# Patient Record
Sex: Female | Born: 1935 | Race: White | Hispanic: No | Marital: Single | Smoking: Never smoker
Health system: Southern US, Community
[De-identification: ages and names within clinical notes are randomized; demographics above are authoritative.]

## PROBLEM LIST (undated history)

## (undated) DIAGNOSIS — E7800 Pure hypercholesterolemia, unspecified: Secondary | ICD-10-CM

## (undated) DIAGNOSIS — I10 Essential (primary) hypertension: Secondary | ICD-10-CM

## (undated) DIAGNOSIS — E079 Disorder of thyroid, unspecified: Secondary | ICD-10-CM

---

## 2010-08-20 ENCOUNTER — Emergency Department (INDEPENDENT_AMBULATORY_CARE_PROVIDER_SITE_OTHER): Payer: Medicare Other

## 2010-08-20 ENCOUNTER — Emergency Department (HOSPITAL_BASED_OUTPATIENT_CLINIC_OR_DEPARTMENT_OTHER)
Admission: EM | Admit: 2010-08-20 | Discharge: 2010-08-20 | Disposition: A | Discharge status: Home / Self Care | Payer: Medicare Other | Attending: Emergency Medicine | Admitting: Emergency Medicine

## 2010-08-20 DIAGNOSIS — E039 Hypothyroidism, unspecified: Secondary | ICD-10-CM | POA: Insufficient documentation

## 2010-08-20 DIAGNOSIS — M79609 Pain in unspecified limb: Secondary | ICD-10-CM

## 2010-08-20 DIAGNOSIS — M25569 Pain in unspecified knee: Secondary | ICD-10-CM

## 2010-08-20 DIAGNOSIS — E7800 Pure hypercholesterolemia, unspecified: Secondary | ICD-10-CM | POA: Insufficient documentation

## 2010-08-20 DIAGNOSIS — Z79899 Other long term (current) drug therapy: Secondary | ICD-10-CM | POA: Insufficient documentation

## 2010-08-20 DIAGNOSIS — M25559 Pain in unspecified hip: Secondary | ICD-10-CM

## 2010-08-20 DIAGNOSIS — M1120 Other chondrocalcinosis, unspecified site: Secondary | ICD-10-CM | POA: Insufficient documentation

## 2010-08-20 DIAGNOSIS — M47817 Spondylosis without myelopathy or radiculopathy, lumbosacral region: Secondary | ICD-10-CM

## 2010-08-20 DIAGNOSIS — I10 Essential (primary) hypertension: Secondary | ICD-10-CM | POA: Insufficient documentation

## 2011-07-01 ENCOUNTER — Emergency Department (INDEPENDENT_AMBULATORY_CARE_PROVIDER_SITE_OTHER): Payer: Medicare Other

## 2011-07-01 ENCOUNTER — Emergency Department (HOSPITAL_BASED_OUTPATIENT_CLINIC_OR_DEPARTMENT_OTHER)
Admission: EM | Admit: 2011-07-01 | Discharge: 2011-07-01 | Disposition: A | Discharge status: Home / Self Care | Payer: Medicare Other | Attending: Emergency Medicine | Admitting: Emergency Medicine

## 2011-07-01 ENCOUNTER — Encounter (HOSPITAL_BASED_OUTPATIENT_CLINIC_OR_DEPARTMENT_OTHER): Payer: Self-pay | Admitting: *Deleted

## 2011-07-01 DIAGNOSIS — M1990 Unspecified osteoarthritis, unspecified site: Secondary | ICD-10-CM | POA: Insufficient documentation

## 2011-07-01 DIAGNOSIS — R109 Unspecified abdominal pain: Secondary | ICD-10-CM | POA: Insufficient documentation

## 2011-07-01 DIAGNOSIS — M79609 Pain in unspecified limb: Secondary | ICD-10-CM

## 2011-07-01 DIAGNOSIS — R059 Cough, unspecified: Secondary | ICD-10-CM | POA: Insufficient documentation

## 2011-07-01 DIAGNOSIS — J3489 Other specified disorders of nose and nasal sinuses: Secondary | ICD-10-CM | POA: Insufficient documentation

## 2011-07-01 DIAGNOSIS — M4120 Other idiopathic scoliosis, site unspecified: Secondary | ICD-10-CM

## 2011-07-01 DIAGNOSIS — E079 Disorder of thyroid, unspecified: Secondary | ICD-10-CM | POA: Insufficient documentation

## 2011-07-01 DIAGNOSIS — I10 Essential (primary) hypertension: Secondary | ICD-10-CM | POA: Insufficient documentation

## 2011-07-01 DIAGNOSIS — R05 Cough: Secondary | ICD-10-CM | POA: Insufficient documentation

## 2011-07-01 DIAGNOSIS — M549 Dorsalgia, unspecified: Secondary | ICD-10-CM

## 2011-07-01 DIAGNOSIS — E7800 Pure hypercholesterolemia, unspecified: Secondary | ICD-10-CM | POA: Insufficient documentation

## 2011-07-01 DIAGNOSIS — I517 Cardiomegaly: Secondary | ICD-10-CM

## 2011-07-01 DIAGNOSIS — M545 Low back pain, unspecified: Secondary | ICD-10-CM | POA: Insufficient documentation

## 2011-07-01 DIAGNOSIS — M810 Age-related osteoporosis without current pathological fracture: Secondary | ICD-10-CM | POA: Insufficient documentation

## 2011-07-01 DIAGNOSIS — M47817 Spondylosis without myelopathy or radiculopathy, lumbosacral region: Secondary | ICD-10-CM

## 2011-07-01 HISTORY — DX: Essential (primary) hypertension: I10

## 2011-07-01 HISTORY — DX: Pure hypercholesterolemia, unspecified: E78.00

## 2011-07-01 HISTORY — DX: Disorder of thyroid, unspecified: E07.9

## 2011-07-01 LAB — CBC
MCH: 29 pg (ref 26.0–34.0)
MCHC: 34.9 g/dL (ref 30.0–36.0)
MCV: 83.2 fL (ref 78.0–100.0)
Platelets: 159 10*3/uL (ref 150–400)

## 2011-07-01 LAB — BASIC METABOLIC PANEL
Calcium: 10 mg/dL (ref 8.4–10.5)
Creatinine, Ser: 0.6 mg/dL (ref 0.50–1.10)
GFR calc non Af Amer: 82 mL/min — ABNORMAL LOW (ref >90)
Glucose, Bld: 116 mg/dL — ABNORMAL HIGH (ref 70–99)
Sodium: 134 mEq/L — ABNORMAL LOW (ref 135–145)

## 2011-07-01 LAB — URINALYSIS, ROUTINE W REFLEX MICROSCOPIC
Glucose, UA: NEGATIVE mg/dL
Hgb urine dipstick: NEGATIVE
Ketones, ur: 15 mg/dL — AB
Protein, ur: NEGATIVE mg/dL
Urobilinogen, UA: 1 mg/dL (ref 0.0–1.0)

## 2011-07-01 MED ORDER — HYDROCODONE-ACETAMINOPHEN 5-325 MG PO TABS: 1.0000 | ORAL_TABLET | Freq: Four times a day (QID) | ORAL | Status: AC | PRN

## 2011-07-01 NOTE — ED Provider Notes (Signed)
History     CSN: [id number]  Arrival date & time 07/01/11  4098   First MD Initiated Contact with Patient 07/01/11 1019      Chief Complaint  Patient presents with  . Back Pain  . Flank Pain  . Leg Pain    (Consider location/radiation/quality/duration/timing/severity/associated sxs/prior treatment) Patient is a 78 y.o. female presenting with back pain, flank pain, and leg pain. The history is provided by the patient.  Back Pain  This is a new problem. The current episode started more than 2 days ago. The problem occurs constantly. The problem has been gradually worsening. The pain is associated with no known injury. The pain is present in the lumbar spine. The quality of the pain is described as stabbing and aching. The pain does not radiate. The pain is at a severity of 8/10. The pain is moderate. The symptoms are aggravated by bending and certain positions. Associated symptoms include leg pain. Pertinent negatives include no chest pain, no fever, no numbness, no headaches, no abdominal pain, no bowel incontinence, no perianal numbness, no bladder incontinence, no dysuria, no pelvic pain, no tingling and no weakness.  Flank Pain Pertinent negatives include no chest pain, no abdominal pain and no headaches.  Leg Pain  Pertinent negatives include no numbness and no tingling.   No trauma or injury. Despite nursing report patient denies radiation into left hip or leg. And no new weakness or numbness. Pain is left low back and flank. Also with nonproductive cough and congestions. No sore throat.  Past Medical History  Diagnosis Date  . Osteoporosis   . Hypertension   . Thyroid disease   . Hypercholesteremia     History reviewed. No pertinent past surgical history.  History reviewed. No pertinent family history.  History  Substance Use Topics  . Smoking status: Never Smoker   . Smokeless tobacco: Not on file  . Alcohol Use: No    OB History    Grav Para Term Preterm Abortions  TAB SAB Ect Mult Living                  Review of Systems  Constitutional: Negative for fever and chills.  HENT: Positive for congestion. Negative for neck pain and neck stiffness.   Respiratory: Positive for cough.   Cardiovascular: Negative for chest pain and leg swelling.  Gastrointestinal: Negative for nausea, vomiting, abdominal pain and bowel incontinence.  Genitourinary: Positive for flank pain. Negative for bladder incontinence, dysuria and pelvic pain.  Musculoskeletal: Positive for back pain.  Skin: Negative for rash.  Neurological: Negative for tingling, weakness, numbness and headaches.  Hematological: Does not bruise/bleed easily.    Allergies  Review of patient's allergies indicates no known allergies.  Home Medications   Current Outpatient Rx  Name Route Sig Dispense Refill  . ALENDRONATE SODIUM 70 MG PO TABS Oral Take 70 mg by mouth every 7 (seven) days. Take with a full glass of water on an empty stomach.    . CLONIDINE HCL 0.1 MG PO TABS Oral Take 0.1 mg by mouth as directed.    . CYCLOBENZAPRINE HCL 5 MG PO TABS Oral Take 5 mg by mouth 2 (two) times daily as needed.    Marland Kitchen LEVOTHYROXINE SODIUM 25 MCG PO TABS Oral Take 25 mcg by mouth daily.    Marland Kitchen LOSARTAN POTASSIUM 50 MG PO TABS Oral Take 50 mg by mouth daily.    Marland Kitchen SIMVASTATIN 20 MG PO TABS Oral Take 20 mg by mouth every  evening.    Marland Kitchen HYDROCODONE-ACETAMINOPHEN 5-325 MG PO TABS Oral Take 1 tablet by mouth every 6 (six) hours as needed for pain. 14 tablet 0    BP 168/79  Pulse 82  Temp(Src) 98.2 F (36.8 C) (Oral)  Resp 18  SpO2 97%  Physical Exam  Constitutional: She is oriented to person, place, and time. She appears well-developed and well-nourished. No distress.  HENT:  Head: Normocephalic and atraumatic.  Mouth/Throat: Oropharynx is clear and moist.  Eyes: Conjunctivae and EOM are normal. Pupils are equal, round, and reactive to light.  Neck: Normal range of motion. Neck supple.  Cardiovascular:  Normal rate, regular rhythm and normal heart sounds.   No murmur heard. Pulmonary/Chest: Effort normal and breath sounds normal. No respiratory distress. She has no wheezes. She has no rales.  Abdominal: Soft. Bowel sounds are normal. There is no tenderness.  Musculoskeletal: Normal range of motion. She exhibits no edema and no tenderness.  Lymphadenopathy:    She has no cervical adenopathy.  Neurological: She is alert and oriented to person, place, and time. No cranial nerve deficit. She exhibits normal muscle tone. Coordination normal.  Skin: Skin is warm. No rash noted.    ED Course  Procedures (including critical care time)  Labs Reviewed  BASIC METABOLIC PANEL - Abnormal; Notable for the following:    Sodium 134 (*)    Glucose, Bld 116 (*)    GFR calc non Af Amer 82 (*)    All other components within normal limits  URINALYSIS, ROUTINE W REFLEX MICROSCOPIC - Abnormal; Notable for the following:    Ketones, ur 15 (*)    All other components within normal limits  CBC   Dg Chest 2 View  07/01/2011  *RADIOLOGY REPORT*  Clinical Data: Back and flank pain.  CHEST - 2 VIEW  Comparison: No comparison studies available.  Findings: The lungs are clear without focal infiltrate, edema, pneumothorax or pleural effusion. Interstitial markings are diffusely coarsened with chronic features. The cardiopericardial silhouette is enlarged.  S-shaped thoracolumbar scoliosis noted. Bones are diffusely demineralized.  IMPRESSION: Cardiomegaly with chronic underlying interstitial coarsening.  No edema or focal airspace consolidation.  Original Report Authenticated By: ERIC A. MANSELL, M.D.   Dg Lumbar Spine Complete  07/01/2011  *RADIOLOGY REPORT*  Clinical Data: Left flank and leg pain.  LUMBAR SPINE - COMPLETE 4+ VIEW  Comparison: None.  Findings: Prominent convex leftward scoliosis of the lumbar spine noted.  This along with the marked diffuse bony demineralization limits assessment.  No definite  fracture is seen in the lumbar spine.  There is advanced endplate degeneration throughout the lumbar spine with prominent facet degeneration in the lower lumbar levels.  Prominent loss of disc height is seen at L4-5.  SI joints are unremarkable.  Coarse calcification projecting over the central pelvis is probably related to uterine fibroids.  IMPRESSION: Marked lumbar scoliosis with advanced degenerative disc/endplate changes and lower lumbar facet osteoarthritis.  Study limited by the scoliosis and prominent diffuse demineralization.  Original Report Authenticated By: ERIC A. MANSELL, M.D.     1. Back pain       MDM  Left back pain with radiology evidence of sig DJD to lumbar spine. No hip pain suggestive of hip fracture.No compression fracture.  No UTI, pyleonephritis, renal insufficiency, pneumonia. Cough thus most likely URI or mild bronchitis. No sig neuro deficits.         Shelda Jakes, MD 07/01/11 2027

## 2011-07-01 NOTE — ED Notes (Signed)
Pt amb to room 7 with quick steady gait in nad. Pt smiling, states she has had left flank area pain x 3 days, with pain raditating up and down her left leg along with tingling.

## 2015-08-10 ENCOUNTER — Emergency Department (HOSPITAL_COMMUNITY)
Admission: EM | Admit: 2015-08-10 | Discharge: 2015-08-10 | Disposition: A | Discharge status: Home / Self Care | Payer: Medicare Other | Attending: Emergency Medicine | Admitting: Emergency Medicine

## 2015-08-10 ENCOUNTER — Emergency Department (HOSPITAL_COMMUNITY): Payer: Medicare Other

## 2015-08-10 ENCOUNTER — Encounter (HOSPITAL_COMMUNITY): Payer: Self-pay | Admitting: Emergency Medicine

## 2015-08-10 DIAGNOSIS — E7800 Pure hypercholesterolemia, unspecified: Secondary | ICD-10-CM | POA: Insufficient documentation

## 2015-08-10 DIAGNOSIS — E079 Disorder of thyroid, unspecified: Secondary | ICD-10-CM | POA: Diagnosis not present

## 2015-08-10 DIAGNOSIS — H539 Unspecified visual disturbance: Secondary | ICD-10-CM

## 2015-08-10 DIAGNOSIS — I10 Essential (primary) hypertension: Secondary | ICD-10-CM | POA: Insufficient documentation

## 2015-08-10 DIAGNOSIS — I679 Cerebrovascular disease, unspecified: Secondary | ICD-10-CM | POA: Insufficient documentation

## 2015-08-10 DIAGNOSIS — Z79899 Other long term (current) drug therapy: Secondary | ICD-10-CM | POA: Insufficient documentation

## 2015-08-10 DIAGNOSIS — H578 Other specified disorders of eye and adnexa: Secondary | ICD-10-CM | POA: Diagnosis present

## 2015-08-10 DIAGNOSIS — Z8739 Personal history of other diseases of the musculoskeletal system and connective tissue: Secondary | ICD-10-CM | POA: Diagnosis not present

## 2015-08-10 LAB — I-STAT TROPONIN, ED: Troponin i, poc: 0 ng/mL (ref 0.00–0.08)

## 2015-08-10 LAB — CBC
HCT: 38.7 % (ref 36.0–46.0)
Hemoglobin: 12.7 g/dL (ref 12.0–15.0)
MCH: 28 pg (ref 26.0–34.0)
MCHC: 32.8 g/dL (ref 30.0–36.0)
MCV: 85.2 fL (ref 78.0–100.0)
Platelets: 167 10*3/uL (ref 150–400)
RBC: 4.54 MIL/uL (ref 3.87–5.11)
RDW: 13.2 % (ref 11.5–15.5)
WBC: 6 10*3/uL (ref 4.0–10.5)

## 2015-08-10 LAB — COMPREHENSIVE METABOLIC PANEL
ALBUMIN: 3.9 g/dL (ref 3.5–5.0)
ALK PHOS: 64 U/L (ref 38–126)
ALT: 16 U/L (ref 14–54)
AST: 24 U/L (ref 15–41)
Anion gap: 10 (ref 5–15)
BUN: 14 mg/dL (ref 6–20)
CALCIUM: 9.8 mg/dL (ref 8.9–10.3)
CHLORIDE: 101 mmol/L (ref 101–111)
CO2: 25 mmol/L (ref 22–32)
CREATININE: 0.96 mg/dL (ref 0.44–1.00)
GFR calc non Af Amer: 51 mL/min — ABNORMAL LOW (ref >60)
GFR, EST AFRICAN AMERICAN: 59 mL/min — AB (ref >60)
GLUCOSE: 114 mg/dL — AB (ref 65–99)
Potassium: 4.6 mmol/L (ref 3.5–5.1)
SODIUM: 136 mmol/L (ref 135–145)
Total Bilirubin: 0.6 mg/dL (ref 0.3–1.2)
Total Protein: 5.9 g/dL — ABNORMAL LOW (ref 6.5–8.1)

## 2015-08-10 LAB — PROTIME-INR
INR: 1.02 (ref 0.00–1.49)
INR: 1.09 (ref 0.00–1.49)
PROTHROMBIN TIME: 13.6 s (ref 11.6–15.2)
PROTHROMBIN TIME: 14.3 s (ref 11.6–15.2)

## 2015-08-10 LAB — RAPID URINE DRUG SCREEN, HOSP PERFORMED
AMPHETAMINES: NOT DETECTED
BENZODIAZEPINES: NOT DETECTED
Barbiturates: NOT DETECTED
Cocaine: NOT DETECTED
Opiates: NOT DETECTED
TETRAHYDROCANNABINOL: NOT DETECTED

## 2015-08-10 LAB — I-STAT CHEM 8, ED
BUN: 17 mg/dL (ref 6–20)
Calcium, Ion: 1.17 mmol/L (ref 1.13–1.30)
Chloride: 99 mmol/L — ABNORMAL LOW (ref 101–111)
Creatinine, Ser: 1 mg/dL (ref 0.44–1.00)
Glucose, Bld: 106 mg/dL — ABNORMAL HIGH (ref 65–99)
HEMATOCRIT: 42 % (ref 36.0–46.0)
HEMOGLOBIN: 14.3 g/dL (ref 12.0–15.0)
Potassium: 4.4 mmol/L (ref 3.5–5.1)
SODIUM: 135 mmol/L (ref 135–145)
TCO2: 25 mmol/L (ref 0–100)

## 2015-08-10 LAB — URINALYSIS, ROUTINE W REFLEX MICROSCOPIC
BILIRUBIN URINE: NEGATIVE
Glucose, UA: NEGATIVE mg/dL
HGB URINE DIPSTICK: NEGATIVE
KETONES UR: NEGATIVE mg/dL
Leukocytes, UA: NEGATIVE
NITRITE: NEGATIVE
PH: 7.5 (ref 5.0–8.0)
Protein, ur: NEGATIVE mg/dL
SPECIFIC GRAVITY, URINE: 1.016 (ref 1.005–1.030)

## 2015-08-10 LAB — DIFFERENTIAL
BASOS ABS: 0 10*3/uL (ref 0.0–0.1)
BASOS PCT: 1 %
EOS ABS: 0.3 10*3/uL (ref 0.0–0.7)
EOS PCT: 5 %
Lymphocytes Relative: 16 %
Lymphs Abs: 0.9 10*3/uL (ref 0.7–4.0)
MONOS PCT: 9 %
Monocytes Absolute: 0.5 10*3/uL (ref 0.1–1.0)
NEUTROS PCT: 69 %
Neutro Abs: 4.2 10*3/uL (ref 1.7–7.7)

## 2015-08-10 LAB — ETHANOL: Alcohol, Ethyl (B): <5 mg/dL (ref <5)

## 2015-08-10 LAB — APTT: aPTT: 25 seconds (ref 24–37)

## 2015-08-10 MED ORDER — IOPAMIDOL (ISOVUE-370) INJECTION 76%
INTRAVENOUS | Status: AC
  Administered 2015-08-10: 80 mL
  Filled 2015-08-10: qty 100

## 2015-08-10 MED ORDER — LABETALOL HCL 5 MG/ML IV SOLN
10.0000 mg | Freq: Once | INTRAVENOUS | Status: AC
Start: 2015-08-10 — End: 2015-08-10
  Administered 2015-08-10: 10 mg via INTRAVENOUS
  Filled 2015-08-10: qty 4

## 2015-08-10 NOTE — Discharge Instructions (Signed)

## 2015-08-10 NOTE — ED Notes (Signed)
[facility] transported [facility] to [facility] by staff.

## 2015-08-10 NOTE — ED Notes (Signed)
Code Stroke cancelled @ 12:59. Spoke w/ Michele McalpinePhil.

## 2015-08-10 NOTE — ED Provider Notes (Signed)
CSN: [id number]     Arrival date & time 08/10/15  1242 History   First MD Initiated Contact with Patient 08/10/15 1242     Chief Complaint  Patient presents with  . Code Stroke   LEVEL 5 CAVEAT DUE TO ACUITY OF CONDITION  Patient is a 82 y.o. female presenting with eye problem. The history is provided by the patient. The history is limited by the condition of the patient.  Eye Problem Location:  L eye Severity:  Severe Onset quality:  Sudden Timing:  Constant Progression:  Resolved Chronicity:  New Context comment:  FLASH OF LIGHT Relieved by:  None tried Worsened by:  Nothing tried pt reports onset of "flash of light like a lightbulb broke" and had some right sided numbness No HA No focal weakness She otherwise felt well and at her baseline   Past Medical History  Diagnosis Date  . Osteoporosis   . Hypertension   . Thyroid disease   . Hypercholesteremia    History reviewed. No pertinent past surgical history. No family history on file. Social History  Substance Use Topics  . Smoking status: Never Smoker   . Smokeless tobacco: None  . Alcohol Use: No   OB History    No data available     Review of Systems  Unable to perform ROS: Acuity of condition      Allergies  Review of patient's allergies indicates no known allergies.  Home Medications   Prior to Admission medications   Medication Sig Start Date End Date Taking? Authorizing Provider  alendronate (FOSAMAX) 70 MG tablet Take 70 mg by mouth every 7 (seven) days. Take with a full glass of water on an empty stomach.    Historical Provider, MD  cloNIDine (CATAPRES) 0.1 MG tablet Take 0.1 mg by mouth as directed.    Historical Provider, MD  cyclobenzaprine (FLEXERIL) 5 MG tablet Take 5 mg by mouth 2 (two) times daily as needed.    Historical Provider, MD  levothyroxine (SYNTHROID, LEVOTHROID) 25 MCG tablet Take 25 mcg by mouth daily.    Historical Provider, MD  losartan (COZAAR) 50 MG tablet Take 50 mg by  mouth daily.    Historical Provider, MD  simvastatin (ZOCOR) 20 MG tablet Take 20 mg by mouth every evening.    Historical Provider, MD   BP 164/79 mmHg  Pulse 88  Temp(Src) 97.6 F (36.4 C) (Oral)  Resp 16  Ht 5\' 5"  (1.651 m)  Wt 90 kg  BMI 33.02 kg/m2  SpO2 99% Physical Exam CONSTITUTIONAL: Well developed/well nourished HEAD: Normocephalic/atraumatic EYES: EOMI/PERRL ENMT: Mucous membranes moist NECK: supple no meningeal signs CV: S1/S2 noted, no murmurs/rubs/gallops noted LUNGS: Lungs are clear to auscultation bilaterally, no apparent distress ABDOMEN: soft, nontender NEURO: Pt is awake/alert/appropriate, moves all extremitiesx4.  No facial droop.  No arm/leg drift EXTREMITIES: full ROM SKIN: warm, color normal PSYCH: no abnormalities of mood noted, alert and oriented to situation  ED Course  Procedures  1:25 PM tPA in stroke considered but not given due to: Cancelled by dr Lavon Paganininandigam neurology 4:04 PM Pt stable No acute findings on CT imaging Pt is awake/alert No focal weakness She is ambulatory She denies HA She has no visual changes She feels well I spoke to dr Lavon Paganininandigam He felt she did not have focal neuro exam He feels she is stable for d/c back to facility Pt agreeable with plan   Labs Review Labs Reviewed  I-STAT CHEM 8, ED - Abnormal; Notable for the  following:    Chloride 99 (*)    Glucose, Bld 106 (*)    All other components within normal limits  CBC  DIFFERENTIAL  ETHANOL  PROTIME-INR  APTT  COMPREHENSIVE METABOLIC PANEL  URINE RAPID DRUG SCREEN, HOSP PERFORMED  URINALYSIS, ROUTINE W REFLEX MICROSCOPIC (NOT AT [facility])  I-STAT TROPOININ, ED    Imaging Review Ct Angio Head W/cm &/or Wo Cm  08/10/2015  CLINICAL DATA:  82 year old female code stroke with right side weakness. Initial encounter. EXAM: CT ANGIOGRAPHY HEAD AND NECK TECHNIQUE: Multidetector CT imaging of the head and neck was performed using the standard protocol during bolus  administration of intravenous contrast. Multiplanar CT image reconstructions and MIPs were obtained to evaluate the vascular anatomy. Carotid stenosis measurements (when applicable) are obtained utilizing NASCET criteria, using the distal internal carotid diameter as the denominator. CONTRAST:  80 mL Isovue 370. COMPARISON:  Head CT without contrast 1240 hours today. FINDINGS: CTA NECK Skeleton: Widespread cervical spine degeneration. Previous left frontotemporal craniotomy for aneurysm clipping. No acute osseous abnormality identified. Visualized paranasal sinuses and mastoids are clear. Other neck: Emphysema. Dependent opacity in the upper lungs favored to be atelectasis. No superior mediastinal lymphadenopathy. Left greater than right thyroid goiter with mostly subcentimeter thyroid nodules. The glottis is closed. Negative pharynx, parapharyngeal spaces and retropharyngeal space. Negative sublingual space, submandibular glands and parotid glands. No cervical lymphadenopathy. Aortic arch: 3 vessel arch configuration with moderate soft and calcified arch atherosclerosis. Right carotid system: Negative right CCA origin. Tortuous proximal right CCA with a kinked appearance. Right carotid bifurcation calcified plaque but not affecting the right ICA origin. However, there is right ICA bulb plaque resulting in 65 % stenosis with respect to the distal vessel. Distal to the bulb the cervical right ICA is negative. Left carotid system: Calcified plaque at the left CCA origin without stenosis. Mildly tortuous proximal left CCA. Circumferential calcified plaque at the left carotid bifurcation and ICA bulb but no associated stenosis. The left ICA appears dominant, and is mildly tortuous proximal to the skullbase. Vertebral arteries: Tortuous right subclavian artery with a kinked appearance at the thoracic inlet. Normal right vertebral artery origin. Dominant right vertebral artery is tortuous in the V2 segment, but otherwise  normal to the skullbase. Soft and calcified plaque in the proximal left subclavian artery resulting in up to 50 % stenosis with respect to the distal vessel. Severe soft and calcified plaque at the left vertebral artery origin with severe stenosis. Diminutive left vertebral artery with poor enhancement in the left V2 segment, but appears to remain patent to the skullbase. CTA HEAD Posterior circulation: Dominant right and diminutive left distal vertebral arteries. Mild calcified plaque in the right V4 segment without stenosis. The left V4 segment is probably supplied in a retrograde fashion. Normal left PICA origin which occurs early. Dominant appearing right AICA origin is normal. No basilar artery stenosis. Patent SCA and right PCA origins. Fetal type left PCA origin. Bilateral PCA branches are normal. Anterior circulation: Non dominant appearing right ICA siphon is patent with mild calcified plaque and no stenosis. Normal right ophthalmic artery origin. Patent right ICA terminus with diminutive or absent right ACA A1 segment. Dominant and dolichoectatic left ICA siphon is patent with mild calcified plaque and no stenosis. The left ophthalmic artery origin is not normal. There is a left supraclinoid segment aneurysm clip. No aneurysmal enhancement identified. Left posterior communicating artery origin is separate and normal. Left ICA terminus is patent. Dominant left A1 segment with normal origin.  Anterior communicating artery and bilateral ACA branches are within normal limits. Right MCA origin is normal. The right M1 segment is patent with mild irregularity. Right MCA bifurcation and MCA branches are within normal limits. Left MCA origin, M1 segment, bifurcation, and left MCA branches are within normal limits. Venous sinuses: Patent. Anatomic variants: Dominant right vertebral artery. Dominant left ICA siphon and left ACA A1 segment. Delayed phase: Stable gray-white matter differentiation. No abnormal  enhancement identified. IMPRESSION: 1. Negative for emergent large vessel occlusion. 2. Severe atherosclerotic stenosis at the left vertebral artery origin with subsequent poor left vertebral artery enhancement in the neck. The right vertebral artery is dominant and without stenosis. The left PICA and V4 segment appeared to be supplied in a retrograde fashion from the right. 3. Cervical right ICA calcified plaque with 65% stenosis. Left carotid calcified plaque without stenosis. 4. 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 5. Stable CT appearance of the brain. 6. Emphysema.  Thyroid multinodular goiter. Electronically Signed   By: Odessa Fleming M.D.   On: 08/10/2015 14:00   Ct Head Wo Contrast  08/10/2015  CLINICAL DATA:  Code stroke for right-sided weakness after seeing/light. History of aneurysm repair. EXAM: CT HEAD WITHOUT CONTRAST TECHNIQUE: Contiguous axial images were obtained from the base of the skull through the vertex without intravenous contrast. COMPARISON:  05/26/2013 FINDINGS: Skull and Sinuses:Remote left frontal pterional craniotomy. No acute or destructive findings. Visualized orbits: Bilateral cataract resection.  No acute finding. Brain: No evidence of acute infarction, hemorrhage, hydrocephalus, or mass lesion/mass effect. Remote left carotid terminus region aneurysm clipping. There is unavoidable streak artifact which obscures portions of the brain at the clip level. Chronic small vessel disease with confluent ischemic gliosis in the biparietal white matter. There has been remote lacunar infarcts in the right frontal white matter and anterior limb internal capsule on the left. Probable dilated perivascular space below the right putamen. Small calcific density within an high right parietal sulcus, contralateral to the symptoms. Normal cerebral volume for age. These results were called by telephone at the time of interpretation on 08/10/2015 at 12:58 pm to Dr.  Lavon Paganini, who verbally acknowledged these results. IMPRESSION: 1. No acute finding. 2. Extensive chronic small vessel disease. 3. Remote aneurysm clipping on the left. Electronically Signed   By: Marnee Spring M.D.   On: 08/10/2015 12:59   Ct Angio Neck W/cm &/or Wo/cm  08/10/2015  CLINICAL DATA:  82 year old female code stroke with right side weakness. Initial encounter. EXAM: CT ANGIOGRAPHY HEAD AND NECK TECHNIQUE: Multidetector CT imaging of the head and neck was performed using the standard protocol during bolus administration of intravenous contrast. Multiplanar CT image reconstructions and MIPs were obtained to evaluate the vascular anatomy. Carotid stenosis measurements (when applicable) are obtained utilizing NASCET criteria, using the distal internal carotid diameter as the denominator. CONTRAST:  80 mL Isovue 370. COMPARISON:  Head CT without contrast 1240 hours today. FINDINGS: CTA NECK Skeleton: Widespread cervical spine degeneration. Previous left frontotemporal craniotomy for aneurysm clipping. No acute osseous abnormality identified. Visualized paranasal sinuses and mastoids are clear. Other neck: Emphysema. Dependent opacity in the upper lungs favored to be atelectasis. No superior mediastinal lymphadenopathy. Left greater than right thyroid goiter with mostly subcentimeter thyroid nodules. The glottis is closed. Negative pharynx, parapharyngeal spaces and retropharyngeal space. Negative sublingual space, submandibular glands and parotid glands. No cervical lymphadenopathy. Aortic arch: 3 vessel arch configuration with moderate soft and calcified arch atherosclerosis. Right carotid system: Negative right  CCA origin. Tortuous proximal right CCA with a kinked appearance. Right carotid bifurcation calcified plaque but not affecting the right ICA origin. However, there is right ICA bulb plaque resulting in 65 % stenosis with respect to the distal vessel. Distal to the bulb the cervical right ICA is  negative. Left carotid system: Calcified plaque at the left CCA origin without stenosis. Mildly tortuous proximal left CCA. Circumferential calcified plaque at the left carotid bifurcation and ICA bulb but no associated stenosis. The left ICA appears dominant, and is mildly tortuous proximal to the skullbase. Vertebral arteries: Tortuous right subclavian artery with a kinked appearance at the thoracic inlet. Normal right vertebral artery origin. Dominant right vertebral artery is tortuous in the V2 segment, but otherwise normal to the skullbase. Soft and calcified plaque in the proximal left subclavian artery resulting in up to 50 % stenosis with respect to the distal vessel. Severe soft and calcified plaque at the left vertebral artery origin with severe stenosis. Diminutive left vertebral artery with poor enhancement in the left V2 segment, but appears to remain patent to the skullbase. CTA HEAD Posterior circulation: Dominant right and diminutive left distal vertebral arteries. Mild calcified plaque in the right V4 segment without stenosis. The left V4 segment is probably supplied in a retrograde fashion. Normal left PICA origin which occurs early. Dominant appearing right AICA origin is normal. No basilar artery stenosis. Patent SCA and right PCA origins. Fetal type left PCA origin. Bilateral PCA branches are normal. Anterior circulation: Non dominant appearing right ICA siphon is patent with mild calcified plaque and no stenosis. Normal right ophthalmic artery origin. Patent right ICA terminus with diminutive or absent right ACA A1 segment. Dominant and dolichoectatic left ICA siphon is patent with mild calcified plaque and no stenosis. The left ophthalmic artery origin is not normal. There is a left supraclinoid segment aneurysm clip. No aneurysmal enhancement identified. Left posterior communicating artery origin is separate and normal. Left ICA terminus is patent. Dominant left A1 segment with normal origin.  Anterior communicating artery and bilateral ACA branches are within normal limits. Right MCA origin is normal. The right M1 segment is patent with mild irregularity. Right MCA bifurcation and MCA branches are within normal limits. Left MCA origin, M1 segment, bifurcation, and left MCA branches are within normal limits. Venous sinuses: Patent. Anatomic variants: Dominant right vertebral artery. Dominant left ICA siphon and left ACA A1 segment. Delayed phase: Stable gray-white matter differentiation. No abnormal enhancement identified. IMPRESSION: 1. Negative for emergent large vessel occlusion. 2. Severe atherosclerotic stenosis at the left vertebral artery origin with subsequent poor left vertebral artery enhancement in the neck. The right vertebral artery is dominant and without stenosis. The left PICA and V4 segment appeared to be supplied in a retrograde fashion from the right. 3. Cervical right ICA calcified plaque with 65% stenosis. Left carotid calcified plaque without stenosis. 4. 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 5. Stable CT appearance of the brain. 6. Emphysema.  Thyroid multinodular goiter. Electronically Signed   By: Odessa Fleming M.D.   On: 08/10/2015 14:00   I have personally reviewed and evaluated these  lab results as part of my medical decision-making.   EKG Interpretation   Date/Time:  Thursday August 10 2015 14:31:28 EDT Ventricular Rate:  73 PR Interval:  182 QRS Duration: 131 QT Interval:  447 QTC Calculation: 493 R Axis:   23 Text Interpretation:  Sinus rhythm Left bundle branch block No previous  ECGs available  Confirmed by Bebe Shaggy  MD, Luvia Orzechowski ([id number]) on 08/10/2015  2:37:08 PM Also confirmed by Bebe Shaggy  MD, [place] Winton ([id number]), editor Stout  CT, Jola Babinski [phone])  on 08/10/2015 2:58:13 PM      MDM   Final diagnoses:  Visual disturbance  Cerebrovascular disease    Nursing notes including past medical history and social history reviewed  and considered in documentation Labs/vital reviewed myself and considered during evaluation     Zadie Rhine, MD 08/10/15 1605

## 2015-08-10 NOTE — ED Notes (Signed)
[facility], developed right sided numbness which resolved pta, VSS, CBG 150, NAD

## 2015-08-10 NOTE — ED Notes (Signed)
[facility] from assisted living, pt reported a "flash of light" followed by right sided numbness - resolved pta, no complaints, pain or deficits on arrival, CBG 150, VSS, NAD

## 2015-08-10 NOTE — ED Notes (Signed)
Pt ambulated with walker at her self reported baseline, no focal weakness or gait abnormality noted

## 2015-08-11 NOTE — Code Documentation (Signed)
Patient states that she suddenly experienced and explosion of behind her left eye which lasted about a minute or so, it was not associated with any headache, but EMS did note some decreased sensory on right.  She has a history of an aneurysm.  Stat head CT and labs done.  NIHSS 0, CTA of the head and neck done.  Code Stroke Canceled.

## 2020-01-28 ENCOUNTER — Emergency Department (HOSPITAL_COMMUNITY)
Admission: EM | Admit: 2020-01-28 | Discharge: 2020-01-28 | Disposition: A | Discharge status: Home / Self Care | Payer: Medicare PPO | Attending: Emergency Medicine | Admitting: Emergency Medicine

## 2020-01-28 ENCOUNTER — Encounter (HOSPITAL_COMMUNITY): Payer: Self-pay | Admitting: Emergency Medicine

## 2020-01-28 ENCOUNTER — Other Ambulatory Visit: Payer: Self-pay

## 2020-01-28 ENCOUNTER — Emergency Department (HOSPITAL_COMMUNITY): Payer: Medicare PPO

## 2020-01-28 DIAGNOSIS — Z7982 Long term (current) use of aspirin: Secondary | ICD-10-CM | POA: Diagnosis not present

## 2020-01-28 DIAGNOSIS — K047 Periapical abscess without sinus: Secondary | ICD-10-CM

## 2020-01-28 DIAGNOSIS — Z91040 Latex allergy status: Secondary | ICD-10-CM | POA: Insufficient documentation

## 2020-01-28 DIAGNOSIS — Z20822 Contact with and (suspected) exposure to covid-19: Secondary | ICD-10-CM | POA: Insufficient documentation

## 2020-01-28 DIAGNOSIS — Z79899 Other long term (current) drug therapy: Secondary | ICD-10-CM | POA: Diagnosis not present

## 2020-01-28 DIAGNOSIS — K0889 Other specified disorders of teeth and supporting structures: Secondary | ICD-10-CM | POA: Diagnosis present

## 2020-01-28 DIAGNOSIS — I10 Essential (primary) hypertension: Secondary | ICD-10-CM | POA: Insufficient documentation

## 2020-01-28 LAB — CBC WITH DIFFERENTIAL/PLATELET
Abs Immature Granulocytes: 0.03 10*3/uL (ref 0.00–0.07)
Basophils Absolute: 0.1 10*3/uL (ref 0.0–0.1)
Basophils Relative: 1 %
Eosinophils Absolute: 0.1 10*3/uL (ref 0.0–0.5)
Eosinophils Relative: 1 %
HCT: 39.1 % (ref 36.0–46.0)
Hemoglobin: 12.2 g/dL (ref 12.0–15.0)
Immature Granulocytes: 1 %
Lymphocytes Relative: 12 %
Lymphs Abs: 0.8 10*3/uL (ref 0.7–4.0)
MCH: 26.9 pg (ref 26.0–34.0)
MCHC: 31.2 g/dL (ref 30.0–36.0)
MCV: 86.3 fL (ref 80.0–100.0)
Monocytes Absolute: 0.6 10*3/uL (ref 0.1–1.0)
Monocytes Relative: 9 %
Neutro Abs: 4.9 10*3/uL (ref 1.7–7.7)
Neutrophils Relative %: 76 %
Platelets: 276 10*3/uL (ref 150–400)
RBC: 4.53 MIL/uL (ref 3.87–5.11)
RDW: 14.5 % (ref 11.5–15.5)
WBC: 6.4 10*3/uL (ref 4.0–10.5)
nRBC: 0 % (ref 0.0–0.2)

## 2020-01-28 LAB — RESPIRATORY PANEL BY RT PCR (FLU A&B, COVID)
Influenza A by PCR: NEGATIVE
Influenza B by PCR: NEGATIVE
SARS Coronavirus 2 by RT PCR: NEGATIVE

## 2020-01-28 LAB — BASIC METABOLIC PANEL
Anion gap: 11 (ref 5–15)
BUN: 12 mg/dL (ref 8–23)
CO2: 25 mmol/L (ref 22–32)
Calcium: 9.2 mg/dL (ref 8.9–10.3)
Chloride: 99 mmol/L (ref 98–111)
Creatinine, Ser: 0.81 mg/dL (ref 0.44–1.00)
GFR, Estimated: >60 mL/min (ref >60)
Glucose, Bld: 116 mg/dL — ABNORMAL HIGH (ref 70–99)
Potassium: 4.5 mmol/L (ref 3.5–5.1)
Sodium: 135 mmol/L (ref 135–145)

## 2020-01-28 MED ORDER — AMOXICILLIN-POT CLAVULANATE 400-57 MG/5ML PO SUSR
800.0000 mg | Freq: Two times a day (BID) | ORAL | Status: DC
  Filled 2020-01-28: qty 10

## 2020-01-28 MED ORDER — IOHEXOL 300 MG/ML  SOLN
75.0000 mL | Freq: Once | INTRAMUSCULAR | Status: AC | PRN
  Administered 2020-01-28: 75 mL via INTRAVENOUS

## 2020-01-28 MED ORDER — AMOXICILLIN-POT CLAVULANATE 875-125 MG PO TABS: 1.0000 | ORAL_TABLET | Freq: Two times a day (BID) | ORAL | 0 refills | Status: DC

## 2020-01-28 MED ORDER — SODIUM CHLORIDE 0.9 % IV SOLN
3.0000 g | Freq: Once | INTRAVENOUS | Status: AC
  Administered 2020-01-28: 3 g via INTRAVENOUS
  Filled 2020-01-28: qty 8

## 2020-01-28 MED ORDER — AMOXICILLIN-POT CLAVULANATE 400-57 MG/5ML PO SUSR: 800.0000 mg | Freq: Two times a day (BID) | ORAL | 0 refills | Status: AC

## 2020-01-28 NOTE — Discharge Instructions (Addendum)
Stop amoxicillin.  Start taking Augmentin as prescribed.   Follow up with Dr. Kenney Houseman as instructed.

## 2020-01-28 NOTE — ED Notes (Signed)
Pt has 3+ swelling of left side of jaw.

## 2020-01-28 NOTE — Consult Note (Signed)
Hospitalist Service Medical Consultation   Kristi Collins  UUV:[id number]  DOB: 05/28/1925  DOA: 01/28/2020  PCP: Patient, No Pcp Per   Requesting physician: Dr. Orlene Plumextra  Reason for consultation: Possible admission for tooth infection   History of Present Illness: Kristi Collins is an 86 y.o. female with PMH significant for some dementia, HTN and hypothyroidism who was sent by her facility per recommendations of her dentist Dr. Fidela SalisburySimoncic for evaluation by oral surgery.  Patient has been having increasing swelling of her left jaw for the past several days.  She was seen by her dentist who noted infected teeth and put her on amoxicillin without improvement in the swelling.  Per ED report, Dr. Fidela SalisburySimoncic wanted the patient referred to oral surgery for what might be complicated surgery.   Patient underwent a CT of her head and neck which showed no evidence of abscess or bone infection.  She does have several rotting teeth and soft tissue swelling around them.  ED physician discussed patient with Dr. Kenney Housemanrab who recommended admission for IV antibiotics but he did not feel there was any need for oral surgery intervention at this point.  He wanted to see her as an outpatient in follow-up.  Of note, patient's POA is present and states that it is possible that has not been taking her amoxicillin regularly but is not sure. She herself states that she thinks she can eat or drink without difficulty.  She can speak without difficulty.  Denies significant pain.  Patient denies fevers or chills but it is not really clear how reliable her history is.  She does not remember whether she had breakfast this morning.   Review of Systems:  Patient has memory loss that makes ROS inaccurate.  Past Medical History: Past Medical History:  Diagnosis Date  . Hypercholesteremia   . Hypertension   . Osteoporosis   . Thyroid disease     Past Surgical History: History reviewed. No pertinent surgical  history.   Allergies:   Allergies  Allergen Reactions  . Latex      Social History:  reports that she has never smoked. She has never used smokeless tobacco. She reports that she does not drink alcohol and does not use drugs.   Family History: No family history on file.   Physical Exam: Vitals:   01/28/20 1345 01/28/20 1409 01/28/20 1430 01/28/20 1515  BP: (!) 155/67  (!) 136/92 (!) 149/70  Pulse: 72 70  70  Resp:      Temp:      TempSrc:      SpO2: 99% 100%  99%  Weight:      Height:       Physical Exam: Blood pressure (!) 149/70, pulse 70, temperature 98.9 F (37.2 C), temperature source Oral, resp. rate 20, height 5\' 5"  (1.651 m), weight 90 kg, SpO2 99 %. Gen: Spry patient lying flat in bed in no acute distress.  HEENT: sclera anicteric, conjuctiva mildly injected bilaterallyShe has significant soft tissue swelling of her left jaw without any overlying erythema.  Patient is able to speak clearly and enunciate clearly and without difficulty.  She is able to swallow and manage secretions with no difficulty. CVS: S1-S2, regulary, no gallops Respiratory:  decreased air entry likely secondary to decreased inspiratory effort GI: NABS, soft, NT  LE: No edema. No cyanosis Neuro: grossly nonfocal.    Data reviewed:  I have personally reviewed following  labs and imaging studies Labs:  CBC: Recent Labs  Lab 01/28/20 1204  WBC 6.4  NEUTROABS 4.9  HGB 12.2  HCT 39.1  MCV 86.3  PLT 276    Basic Metabolic Panel: Recent Labs  Lab 01/28/20 1204  NA 135  K 4.5  CL 99  CO2 25  GLUCOSE 116*  BUN 12  CREATININE 0.81  CALCIUM 9.2   GFR Estimated Creatinine Clearance: 49.1 mL/min (by C-G formula based on SCr of 0.81 mg/dL). Liver Function Tests: No results for input(s): AST, ALT, ALKPHOS, BILITOT, PROT, ALBUMIN in the last 168 hours. No results for input(s): LIPASE, AMYLASE in the last 168 hours. No results for input(s): AMMONIA in the last 168  hours. Coagulation profile No results for input(s): INR, PROTIME in the last 168 hours.  Cardiac Enzymes: No results for input(s): CKTOTAL, CKMB, CKMBINDEX, TROPONINI in the last 168 hours. BNP: Invalid input(s): POCBNP CBG: No results for input(s): GLUCAP in the last 168 hours. D-Dimer No results for input(s): DDIMER in the last 72 hours. Hgb A1c No results for input(s): HGBA1C in the last 72 hours. Lipid Profile No results for input(s): CHOL, HDL, LDLCALC, TRIG, CHOLHDL, LDLDIRECT in the last 72 hours. Thyroid function studies No results for input(s): TSH, T4TOTAL, T3FREE, THYROIDAB in the last 72 hours.  Invalid input(s): FREET3 Anemia work up No results for input(s): VITAMINB12, FOLATE, FERRITIN, TIBC, IRON, RETICCTPCT in the last 72 hours. Urinalysis    Component Value Date/Time   COLORURINE YELLOW 08/10/2015 1358   APPEARANCEUR CLEAR 08/10/2015 1358   LABSPEC 1.016 08/10/2015 1358   PHURINE 7.5 08/10/2015 1358   GLUCOSEU NEGATIVE 08/10/2015 1358   HGBUR NEGATIVE 08/10/2015 1358   BILIRUBINUR NEGATIVE 08/10/2015 1358   KETONESUR NEGATIVE 08/10/2015 1358   PROTEINUR NEGATIVE 08/10/2015 1358   UROBILINOGEN 1.0 07/01/2011 1153   NITRITE NEGATIVE 08/10/2015 1358   LEUKOCYTESUR NEGATIVE 08/10/2015 1358     Sepsis Labs Invalid input(s): PROCALCITONIN,  WBC,  LACTICIDVEN Microbiology Recent Results (from the past 240 hour(s))  Respiratory Panel by RT PCR (Flu A&B, Covid) - Nasopharyngeal Swab     Status: None   Collection Time: 01/28/20 12:04 PM   Specimen: Nasopharyngeal Swab  Result Value Ref Range Status   SARS Coronavirus 2 by RT PCR NEGATIVE NEGATIVE Final    Comment: (NOTE) SARS-CoV-2 target nucleic acids are NOT DETECTED.  The SARS-CoV-2 RNA is generally detectable in upper respiratoy specimens during the acute phase of infection. The lowest concentration of SARS-CoV-2 viral copies this assay can detect is 131 copies/mL. A negative result does not preclude  SARS-Cov-2 infection and should not be used as the sole basis for treatment or other patient management decisions. A negative result may occur with  improper specimen collection/handling, submission of specimen other than nasopharyngeal swab, presence of viral mutation(s) within the areas targeted by this assay, and inadequate number of viral copies (<131 copies/mL). A negative result must be combined with clinical observations, patient history, and epidemiological information. The expected result is Negative.  Fact Sheet for Patients:  https://www.moore.com/  Fact Sheet for Healthcare Providers:  https://www.young.biz/  This test is no t yet approved or cleared by the [place] FDA and  has been authorized for detection and/or diagnosis of SARS-CoV-2 by FDA under an Emergency Use Authorization (EUA). This EUA will remain  in effect (meaning this test can be used) for the duration of the COVID-19 declaration under Section 564(b)(1) of the Act, 21 U.S.C. section 360bbb-3(b)(1), unless the authorization is terminated  or revoked sooner.     Influenza A by PCR NEGATIVE NEGATIVE Final   Influenza B by PCR NEGATIVE NEGATIVE Final    Comment: (NOTE) The Xpert Xpress SARS-CoV-2/FLU/RSV assay is intended as an aid in  the diagnosis of influenza from Nasopharyngeal swab specimens and  should not be used as a sole basis for treatment. Nasal washings and  aspirates are unacceptable for Xpert Xpress SARS-CoV-2/FLU/RSV  testing.  Fact Sheet for Patients: https://www.moore.com/  Fact Sheet for Healthcare Providers: https://www.young.biz/  This test is not yet approved or cleared by the [place] FDA and  has been authorized for detection and/or diagnosis of SARS-CoV-2 by  FDA under an Emergency Use Authorization (EUA). This EUA will remain  in effect (meaning this test can be used) for the duration of the   Covid-19 declaration under Section 564(b)(1) of the Act, 21  U.S.C. section 360bbb-3(b)(1), unless the authorization is  terminated or revoked. Performed at [facility], [address] [place], [place] [address]        Inpatient Medications:   Scheduled Meds: . [START ON 01/29/2020] amoxicillin-clavulanate  800 mg Oral Q12H   Continuous Infusions:    Radiological Exams on Admission: CT Maxillofacial W Contrast  Result Date: 01/28/2020 CLINICAL DATA:  Left lower jaw dental abscess EXAM: CT MAXILLOFACIAL WITH CONTRAST TECHNIQUE: Multidetector CT imaging of the maxillofacial structures was performed with intravenous contrast. Multiplanar CT image reconstructions were also generated. CONTRAST:  78mL OMNIPAQUE IOHEXOL 300 MG/ML  SOLN COMPARISON:  None. FINDINGS: Motion artifact is present including through the mandible. Streak artifact from dental amalgam. Osseous: Tooth decay is present. In the left mandibular region, there is notable involvement of the posterior left premolar. Temporomandibular joints are unremarkable. Multilevel degenerative changes of the cervical spine. Orbits: No significant abnormality. Sinuses: No significant opacification. Soft tissues: There is left facial soft tissue swelling particularly along the body of the mandible extending to the buccal surface. There is no soft tissue abscess identified. 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 No abnormal intracranial enhancement identified. IMPRESSION: Suboptimal evaluation due to motion and streak artifact. Soft tissue swelling particularly along the body of the mandible on the left. Multifocal tooth decay including involvement of the posterior left mandibular premolar. No definite soft tissue abscess. Electronically Signed   By: Guadlupe Spanish M.D.   On: 01/28/2020 14:45    Impression/Recommendations Active  Problems:   Tooth infection  Soft tissue infection around multiple decayed teeth. Per ED discussion with Dr. Kenney Houseman, there is no indication for oral surgery involvement at this point and she will be seen in follow-up as an outpatient. She is afebrile, has no leukocytosis and no evidence of bony involvement or systemic infection. I discussed patient with ID consultation, Dr. Luciana Axe and he agreed that the bioavailability of oral versus IV amoxicillin is not very different.  He did recommend broadening coverage from amoxicillin to Augmentin.   Given the known iatrogenic risks of admitting a 86 year old female to the hospital in the middle of a Covid epidemic, in my judgment it would be safer for the patient to be discharged home on oral Augmentin (liquid if needed) given no material difference in bioavailability and IV versus oral preparation.   Recommendations: Administer bedside swallow study to make sure patient can keep up with p.o. intake Augmentin 875 twice daily x10 days Follow-up with Dr. Kenney Houseman within the next 10 days for likely tooth removal Ensure  that patient has the outpatient support needed to be compliant with the antibiotic regimen as prescribed.  Discussed with patient's POA and Dr Stevie Kern.    Pieter Partridge M.D. [facility] 01/28/2020, 4:14 PM Please page Via [email or web address] for questions

## 2020-01-28 NOTE — ED Triage Notes (Signed)
Pt sent here to ED by oral surgeon to have pt evaluated for a dental abscess that he don't feel comfortable to made surgery on. Pt is AO only to her self POC at her side.

## 2020-01-28 NOTE — ED Notes (Signed)
Called [facility] 

## 2020-01-28 NOTE — ED Provider Notes (Signed)
[facility] EMERGENCY DEPARTMENT Provider Note   CSN: [id number] Arrival date & time: 01/28/20  1101     History Chief Complaint  Patient presents with  . Dental Pain    Kristi Collins is a 86 y.o. female.  86 year old female with prior medical history as detailed below presents for evaluation of dental pain and swelling.  Patient was sent to the ED by Dr. Fidela Salisbury.  The patient has been on amoxicillin for the last 5 days.  She has continued swelling and tenderness to the left lower jaw.  She was diagnosed with a likely infection of tooth #20.  Patient was sent to our facility for evaluation by oral surgery.  The history is provided by the patient and medical records (POA present at bedside ).  Dental Pain Location:  Lower Lower teeth location:  20/LL 2nd bicuspid Quality:  Aching Severity:  Mild Onset quality:  Gradual Duration:  1 week Timing:  Constant Progression:  Worsening Chronicity:  New Context: abscess   Relieved by:  Nothing Worsened by:  Nothing      Past Medical History:  Diagnosis Date  . Hypercholesteremia   . Hypertension   . Osteoporosis   . Thyroid disease     There are no problems to display for this patient.   History reviewed. No pertinent surgical history.   OB History   No obstetric history on file.     No family history on file.  Social History   Tobacco Use  . Smoking status: Never Smoker  . Smokeless tobacco: Never Used  Substance Use Topics  . Alcohol use: No  . Drug use: No    Home Medications Prior to Admission medications   Medication Sig Start Date End Date Taking? Authorizing Provider  amLODipine (NORVASC) 5 MG tablet Take 5 mg by mouth every evening.     [provider]  amoxicillin-clavulanate (AUGMENTIN) 875-125 MG tablet Take 1 tablet by mouth every 12 (twelve) hours. 01/28/20   Wynetta Fines, MD  aspirin 81 MG chewable tablet Chew 81 mg by mouth daily.    [provider]    Calcium Carb-Cholecalciferol (CALCIUM 600 + D) 600-200 MG-UNIT TABS Take 1 tablet by mouth daily.    [provider]  cholecalciferol (VITAMIN D) 1000 units tablet Take 1,000 Units by mouth daily.    [provider]  diclofenac sodium (VOLTAREN) 1 % GEL Apply 4 g topically 4 (four) times daily as needed (pain).    [provider]  docusate sodium (COLACE) 100 MG capsule Take 100 mg by mouth daily.     [provider]  donepezil (ARICEPT) 5 MG tablet Take 5 mg by mouth daily.     [provider]  DULoxetine (CYMBALTA) 20 MG capsule Take 20 mg by mouth daily. 07/19/15   [provider]  hydroxypropyl methylcellulose / hypromellose (ISOPTO TEARS / GONIOVISC) 2.5 % ophthalmic solution Place 1 drop into both eyes every 4 (four) hours as needed for dry eyes.    [provider]  levothyroxine (SYNTHROID, LEVOTHROID) 25 MCG tablet Take 25 mcg by mouth daily.    [provider]  LORazepam (ATIVAN) 0.5 MG tablet Take 5 mg by mouth 2 (two) times daily. 07/28/15   [provider]  losartan (COZAAR) 50 MG tablet Take 50 mg by mouth daily.    [provider]  Meclizine HCl 25 MG CHEW Chew 25 mg by mouth 2 (two) times daily.    [provider]  meloxicam (MOBIC) 7.5 MG tablet Take 7.5 mg by mouth daily.    [provider]  neomycin-colistin-hydrocortisone-thonzonium (CORTISPORIN TC) 3.06-22-08-0.5 MG/ML otic suspension Place 4 drops into both ears 2 (two) times daily as needed (ear pain).    [provider]  omeprazole (PRILOSEC) 20 MG capsule Take 20 mg by mouth daily. 07/10/15   [provider]  Polyethyl Glycol-Propyl Glycol (SYSTANE PRESERVATIVE FREE) 0.4-0.3 % SOLN Apply 1 drop to eye 2 (two) times daily as needed (dry eyes).    [provider]  simvastatin (ZOCOR) 20 MG tablet Take 20 mg by mouth every evening.    [provider]  traMADol (ULTRAM) 50 MG tablet Take 50 mg  by mouth 3 (three) times daily. 07/14/15   [provider]    Allergies    Latex  Review of Systems   Review of Systems  All other systems reviewed and are negative.   Physical Exam Updated Vital Signs BP (!) 149/70   Pulse 70   Temp 98.9 F (37.2 C) (Oral)   Resp 20   Ht 5\' 5"  (1.651 m)   Wt 90 kg   SpO2 99%   BMI 33.02 kg/m   Physical Exam Vitals and nursing note reviewed.  Constitutional:      General: She is not in acute distress.    Appearance: She is well-developed.  HENT:     Head: Normocephalic and atraumatic.     Mouth/Throat:     Comments: Moderate edema along the left lower jaw line.  No discrete abscess noted along the gumline along the left lower jaw.  Patient with marked edema of the buccal surface along the left lower jaw. Eyes:     Conjunctiva/sclera: Conjunctivae normal.     Pupils: Pupils are equal, round, and reactive to light.  Cardiovascular:     Rate and Rhythm: Normal rate and regular rhythm.     Heart sounds: Normal heart sounds.  Pulmonary:     Effort: Pulmonary effort is normal. No respiratory distress.     Breath sounds: Normal breath sounds.  Abdominal:     General: There is no distension.     Palpations: Abdomen is soft.     Tenderness: There is no abdominal tenderness.  Musculoskeletal:        General: No deformity. Normal range of motion.     Cervical back: Normal range of motion and neck supple.  Skin:    General: Skin is warm and dry.  Neurological:     Mental Status: She is alert and oriented to person, place, and time.     ED Results / Procedures / Treatments   Labs (all labs ordered are listed, but only abnormal results are displayed) Labs Reviewed  BASIC METABOLIC PANEL - Abnormal; Notable for the following components:      Result Value   Glucose, Bld 116 (*)    All other components within normal limits  RESPIRATORY PANEL BY RT PCR (FLU A&B, COVID)  CBC WITH DIFFERENTIAL/PLATELET     EKG None  Radiology CT Maxillofacial W Contrast  Result Date: 01/28/2020 CLINICAL DATA:  Left lower jaw dental abscess EXAM: CT MAXILLOFACIAL WITH CONTRAST TECHNIQUE: Multidetector CT imaging of the maxillofacial structures was performed with intravenous contrast. Multiplanar CT image reconstructions were also generated. CONTRAST:  62mL OMNIPAQUE IOHEXOL 300 MG/ML  SOLN COMPARISON:  None. FINDINGS: Motion artifact is present including through the mandible. Streak artifact from dental amalgam. Osseous: Tooth decay is present. In the  left mandibular region, there is notable involvement of the posterior left premolar. Temporomandibular joints are unremarkable. Multilevel degenerative changes of the cervical spine. Orbits: No significant abnormality. Sinuses: No significant opacification. Soft tissues: There is left facial soft tissue swelling particularly along the body of the mandible extending to the buccal surface. There is no soft tissue abscess identified. 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 No abnormal intracranial enhancement identified. IMPRESSION: Suboptimal evaluation due to motion and streak artifact. Soft tissue swelling particularly along the body of the mandible on the left. Multifocal tooth decay including involvement of the posterior left mandibular premolar. No definite soft tissue abscess. Electronically Signed   By: Guadlupe Spanish M.D.   On: 01/28/2020 14:45    Procedures Procedures (including critical care time)  Medications Ordered in ED Medications  Ampicillin-Sulbactam (UNASYN) 3 g in sodium chloride 0.9 % 100 mL IVPB (3 g Intravenous New Bag/Given 01/28/20 1537)  amoxicillin-clavulanate (AUGMENTIN) 400-57 MG/5ML suspension 800 mg (has no administration in time range)  iohexol (OMNIPAQUE) 300 MG/ML solution 75 mL (75 mLs Intravenous Contrast Given 01/28/20 1432)    ED  Course  I have reviewed the triage vital signs and the nursing notes.  Pertinent labs & imaging results that were available during my care of the patient were reviewed by me and considered in my medical decision making (see chart for details).    MDM Rules/Calculators/A&P                          MDM  Screen complete  Azayla Polo was evaluated in Emergency Department on 01/28/2020 for the symptoms described in the history of present illness. She was evaluated in the context of the global COVID-19 pandemic, which necessitated consideration that the patient might be at risk for infection with the SARS-CoV-2 virus that causes COVID-19. Institutional protocols and algorithms that pertain to the evaluation of patients at risk for COVID-19 are in a state of rapid change based on information released by regulatory bodies including the CDC and federal and state organizations. These policies and algorithms were followed during the patient's care in the ED.   Patient was sent in for oral surgery evaluation.  Patient with left lower jaw infection of the dentition.  CT imaging did not reveal evidence of discrete abscess.  Patient's case was discussed with Dr. Kenney Houseman of oral surgery.  Dr. Luberta Robertson of the hospitalist service was consulted for admission.  Per Dr. Luberta Robertson, the patient does not meet criteria for admission.  Patient is to be discharged.  Recommendation for continued outpatient therapy with Augmentin and FU with oral surgery.   Final Clinical Impression(s) / ED Diagnoses Final diagnoses:  Pain, dental    Rx / DC Orders ED Discharge Orders         Ordered    amoxicillin-clavulanate (AUGMENTIN) 875-125 MG tablet  Every 12 hours        01/28/20 1603           Wynetta Fines, MD 01/28/20 1607

## 2020-01-28 NOTE — ED Notes (Signed)
Called Kristi Collins to let her know pt's wheelchair is in the facility and [facility] is unable to transport w/c in ambulance. Phone number was incorrect and was given to this RN by [employer]. Made RN at facility aware so that she can update family on whereabouts of w/c.

## 2020-01-28 NOTE — ED Notes (Signed)
Patient transported to CT 

## 2020-02-16 ENCOUNTER — Emergency Department (HOSPITAL_COMMUNITY): Payer: Medicare PPO

## 2020-02-16 ENCOUNTER — Inpatient Hospital Stay (HOSPITAL_COMMUNITY)
Admission: EM | Admit: 2020-02-16 | Discharge: 2020-02-21 | DRG: 872 | Disposition: A | Discharge status: Skilled Nursing Facility | Payer: Medicare PPO | Attending: Family Medicine | Admitting: Family Medicine

## 2020-02-16 DIAGNOSIS — K047 Periapical abscess without sinus: Secondary | ICD-10-CM | POA: Diagnosis present

## 2020-02-16 DIAGNOSIS — B9620 Unspecified Escherichia coli [E. coli] as the cause of diseases classified elsewhere: Secondary | ICD-10-CM | POA: Diagnosis present

## 2020-02-16 DIAGNOSIS — E876 Hypokalemia: Secondary | ICD-10-CM | POA: Diagnosis present

## 2020-02-16 DIAGNOSIS — Z66 Do not resuscitate: Secondary | ICD-10-CM | POA: Diagnosis present

## 2020-02-16 DIAGNOSIS — Z9049 Acquired absence of other specified parts of digestive tract: Secondary | ICD-10-CM

## 2020-02-16 DIAGNOSIS — N739 Female pelvic inflammatory disease, unspecified: Secondary | ICD-10-CM

## 2020-02-16 DIAGNOSIS — K529 Noninfective gastroenteritis and colitis, unspecified: Secondary | ICD-10-CM

## 2020-02-16 DIAGNOSIS — R4189 Other symptoms and signs involving cognitive functions and awareness: Secondary | ICD-10-CM | POA: Diagnosis present

## 2020-02-16 DIAGNOSIS — Z79891 Long term (current) use of opiate analgesic: Secondary | ICD-10-CM

## 2020-02-16 DIAGNOSIS — A419 Sepsis, unspecified organism: Secondary | ICD-10-CM | POA: Diagnosis not present

## 2020-02-16 DIAGNOSIS — F0390 Unspecified dementia without behavioral disturbance: Secondary | ICD-10-CM | POA: Diagnosis present

## 2020-02-16 DIAGNOSIS — E785 Hyperlipidemia, unspecified: Secondary | ICD-10-CM | POA: Diagnosis present

## 2020-02-16 DIAGNOSIS — Z993 Dependence on wheelchair: Secondary | ICD-10-CM

## 2020-02-16 DIAGNOSIS — R7401 Elevation of levels of liver transaminase levels: Secondary | ICD-10-CM | POA: Diagnosis present

## 2020-02-16 DIAGNOSIS — R748 Abnormal levels of other serum enzymes: Secondary | ICD-10-CM | POA: Diagnosis present

## 2020-02-16 DIAGNOSIS — N390 Urinary tract infection, site not specified: Secondary | ICD-10-CM | POA: Diagnosis present

## 2020-02-16 DIAGNOSIS — I10 Essential (primary) hypertension: Secondary | ICD-10-CM | POA: Diagnosis present

## 2020-02-16 DIAGNOSIS — Z91040 Latex allergy status: Secondary | ICD-10-CM

## 2020-02-16 DIAGNOSIS — E872 Acidosis: Secondary | ICD-10-CM | POA: Diagnosis present

## 2020-02-16 DIAGNOSIS — M810 Age-related osteoporosis without current pathological fracture: Secondary | ICD-10-CM | POA: Diagnosis present

## 2020-02-16 DIAGNOSIS — R509 Fever, unspecified: Secondary | ICD-10-CM | POA: Diagnosis not present

## 2020-02-16 DIAGNOSIS — Z79899 Other long term (current) drug therapy: Secondary | ICD-10-CM

## 2020-02-16 DIAGNOSIS — Z7982 Long term (current) use of aspirin: Secondary | ICD-10-CM

## 2020-02-16 DIAGNOSIS — K759 Inflammatory liver disease, unspecified: Secondary | ICD-10-CM

## 2020-02-16 DIAGNOSIS — R7989 Other specified abnormal findings of blood chemistry: Secondary | ICD-10-CM

## 2020-02-16 DIAGNOSIS — Z20822 Contact with and (suspected) exposure to covid-19: Secondary | ICD-10-CM | POA: Diagnosis present

## 2020-02-16 LAB — APTT: aPTT: 23 seconds — ABNORMAL LOW (ref 24–36)

## 2020-02-16 LAB — COMPREHENSIVE METABOLIC PANEL
ALT: 251 U/L — ABNORMAL HIGH (ref 0–44)
AST: 401 U/L — ABNORMAL HIGH (ref 15–41)
Albumin: 2.9 g/dL — ABNORMAL LOW (ref 3.5–5.0)
Alkaline Phosphatase: 332 U/L — ABNORMAL HIGH (ref 38–126)
Anion gap: 11 (ref 5–15)
BUN: 18 mg/dL (ref 8–23)
CO2: 22 mmol/L (ref 22–32)
Calcium: 8.8 mg/dL — ABNORMAL LOW (ref 8.9–10.3)
Chloride: 101 mmol/L (ref 98–111)
Creatinine, Ser: 0.86 mg/dL (ref 0.44–1.00)
GFR, Estimated: >60 mL/min (ref >60)
Glucose, Bld: 153 mg/dL — ABNORMAL HIGH (ref 70–99)
Potassium: 3.9 mmol/L (ref 3.5–5.1)
Sodium: 134 mmol/L — ABNORMAL LOW (ref 135–145)
Total Bilirubin: 2 mg/dL — ABNORMAL HIGH (ref 0.3–1.2)
Total Protein: 5.5 g/dL — ABNORMAL LOW (ref 6.5–8.1)

## 2020-02-16 LAB — PROTIME-INR
INR: 1 (ref 0.8–1.2)
Prothrombin Time: 13.2 seconds (ref 11.4–15.2)

## 2020-02-16 LAB — URINALYSIS, ROUTINE W REFLEX MICROSCOPIC
Bilirubin Urine: NEGATIVE
Glucose, UA: NEGATIVE mg/dL
Hgb urine dipstick: NEGATIVE
Ketones, ur: 20 mg/dL — AB
Leukocytes,Ua: NEGATIVE
Nitrite: NEGATIVE
Protein, ur: 30 mg/dL — AB
Specific Gravity, Urine: 1.021 (ref 1.005–1.030)
pH: 5 (ref 5.0–8.0)

## 2020-02-16 LAB — LACTIC ACID, PLASMA
Lactic Acid, Venous: 3.7 mmol/L (ref 0.5–1.9)
Lactic Acid, Venous: 3.8 mmol/L (ref 0.5–1.9)

## 2020-02-16 LAB — CBC WITH DIFFERENTIAL/PLATELET
Abs Immature Granulocytes: 0.04 10*3/uL (ref 0.00–0.07)
Basophils Absolute: 0 10*3/uL (ref 0.0–0.1)
Basophils Relative: 0 %
Eosinophils Absolute: 0 10*3/uL (ref 0.0–0.5)
Eosinophils Relative: 0 %
HCT: 36.1 % (ref 36.0–46.0)
Hemoglobin: 11.7 g/dL — ABNORMAL LOW (ref 12.0–15.0)
Immature Granulocytes: 0 %
Lymphocytes Relative: 2 %
Lymphs Abs: 0.2 10*3/uL — ABNORMAL LOW (ref 0.7–4.0)
MCH: 27.7 pg (ref 26.0–34.0)
MCHC: 32.4 g/dL (ref 30.0–36.0)
MCV: 85.5 fL (ref 80.0–100.0)
Monocytes Absolute: 0.8 10*3/uL (ref 0.1–1.0)
Monocytes Relative: 8 %
Neutro Abs: 8.5 10*3/uL — ABNORMAL HIGH (ref 1.7–7.7)
Neutrophils Relative %: 90 %
Platelets: 131 10*3/uL — ABNORMAL LOW (ref 150–400)
RBC: 4.22 MIL/uL (ref 3.87–5.11)
RDW: 15 % (ref 11.5–15.5)
WBC: 9.6 10*3/uL (ref 4.0–10.5)
nRBC: 0 % (ref 0.0–0.2)

## 2020-02-16 LAB — I-STAT VENOUS BLOOD GAS, ED
Acid-base deficit: 1 mmol/L (ref 0.0–2.0)
Bicarbonate: 22.8 mmol/L (ref 20.0–28.0)
Calcium, Ion: 1.11 mmol/L — ABNORMAL LOW (ref 1.15–1.40)
HCT: 34 % — ABNORMAL LOW (ref 36.0–46.0)
Hemoglobin: 11.6 g/dL — ABNORMAL LOW (ref 12.0–15.0)
O2 Saturation: 95 %
Potassium: 3.9 mmol/L (ref 3.5–5.1)
Sodium: 135 mmol/L (ref 135–145)
TCO2: 24 mmol/L (ref 22–32)
pCO2, Ven: 33.1 mmHg — ABNORMAL LOW (ref 44.0–60.0)
pH, Ven: 7.447 — ABNORMAL HIGH (ref 7.250–7.430)
pO2, Ven: 70 mmHg — ABNORMAL HIGH (ref 32.0–45.0)

## 2020-02-16 LAB — RESPIRATORY PANEL BY RT PCR (FLU A&B, COVID)
Influenza A by PCR: NEGATIVE
Influenza B by PCR: NEGATIVE
SARS Coronavirus 2 by RT PCR: NEGATIVE

## 2020-02-16 MED ORDER — ACETAMINOPHEN 325 MG PO TABS
650.0000 mg | ORAL_TABLET | Freq: Once | ORAL | Status: AC
  Administered 2020-02-16: 650 mg via ORAL
  Filled 2020-02-16: qty 2

## 2020-02-16 MED ORDER — LACTATED RINGERS IV BOLUS (SEPSIS)
1000.0000 mL | Freq: Once | INTRAVENOUS | Status: AC
  Administered 2020-02-16: 1000 mL via INTRAVENOUS

## 2020-02-16 MED ORDER — METRONIDAZOLE IN NACL 5-0.79 MG/ML-% IV SOLN
500.0000 mg | Freq: Once | INTRAVENOUS | Status: AC
  Administered 2020-02-16: 500 mg via INTRAVENOUS
  Filled 2020-02-16: qty 100

## 2020-02-16 MED ORDER — LACTATED RINGERS IV SOLN: INTRAVENOUS | Status: DC

## 2020-02-16 MED ORDER — VANCOMYCIN HCL IN DEXTROSE 1-5 GM/200ML-% IV SOLN: 1000.0000 mg | Freq: Once | INTRAVENOUS | Status: DC

## 2020-02-16 MED ORDER — SODIUM CHLORIDE 0.9 % IV SOLN
2.0000 g | Freq: Once | INTRAVENOUS | Status: AC
  Administered 2020-02-16: 2 g via INTRAVENOUS
  Filled 2020-02-16: qty 2

## 2020-02-16 MED ORDER — VANCOMYCIN HCL 750 MG/150ML IV SOLN
750.0000 mg | Freq: Two times a day (BID) | INTRAVENOUS | Status: DC
  Administered 2020-02-17: 750 mg via INTRAVENOUS
  Filled 2020-02-16 (×2): qty 150

## 2020-02-16 MED ORDER — SODIUM CHLORIDE 0.9 % IV SOLN
2.0000 g | Freq: Two times a day (BID) | INTRAVENOUS | Status: DC
  Administered 2020-02-17 – 2020-02-19 (×5): 2 g via INTRAVENOUS
  Filled 2020-02-16 (×5): qty 2

## 2020-02-16 MED ORDER — VANCOMYCIN HCL 2000 MG/400ML IV SOLN
2000.0000 mg | Freq: Once | INTRAVENOUS | Status: AC
  Administered 2020-02-16: 2000 mg via INTRAVENOUS
  Filled 2020-02-16: qty 400

## 2020-02-16 NOTE — ED Triage Notes (Signed)
PT BIB EMS from [facility] , from abdominal pain with n/v. Per facility 45 minutes ago , pt became more lethargic pt normally talking. Pt has know tooth abscess on abx but has not followed up yet. Pt was on RA at 92 percent,. Saturations decreased during transport to 72 with a 15 second period of apena  Hx dementia , DM , DNR  Temp 102 temporal  HR 110 122/77 RR 34  cbg 239  20 L.AC , GIVEN 400 ns

## 2020-02-16 NOTE — ED Provider Notes (Signed)
[facility] Provider Note   CSN: [id number] Arrival date & time: 02/16/20  1932     History Chief Complaint  Patient presents with  . Abdominal Pain  . Vomiting    Kristi Collins is a 86 y.o. female history of hypertension, high cholesterol, here presenting with fever, abdominal pain.  Patient is from a nursing home and demented.  She was noted to have nausea vomiting and abdominal pain prior to arrival.  When the patient was getting transported she apparently stopped breathing for 15 seconds.  He is demented and unable to give me much history.  Patient states that she feels fine.  She apparently had a dental infection but not currently on antibiotics.  The history is provided by the nursing home.       Past Medical History:  Diagnosis Date  . Hypercholesteremia   . Hypertension   . Osteoporosis   . Thyroid disease     Patient Active Problem List   Diagnosis Date Noted  . Tooth infection 01/28/2020    No past surgical history on file.   OB History   No obstetric history on file.     No family history on file.  Social History   Tobacco Use  . Smoking status: Never Smoker  . Smokeless tobacco: Never Used  Substance Use Topics  . Alcohol use: No  . Drug use: No    Home Medications Prior to Admission medications   Medication Sig Start Date End Date Taking? Authorizing Provider  amLODipine (NORVASC) 5 MG tablet Take 5 mg by mouth every evening.     [provider]  amoxicillin-clavulanate (AUGMENTIN) 875-125 MG tablet Take 1 tablet by mouth every 12 (twelve) hours. 01/28/20   Wynetta FinesMessick, Peter C, MD  aspirin 81 MG chewable tablet Chew 81 mg by mouth daily.    [provider]  Calcium Carb-Cholecalciferol (CALCIUM 600 + D) 600-200 MG-UNIT TABS Take 1 tablet by mouth daily.    [provider]  cholecalciferol (VITAMIN D) 1000 units tablet Take 1,000 Units by mouth daily.    [provider]    diclofenac sodium (VOLTAREN) 1 % GEL Apply 4 g topically 4 (four) times daily as needed (pain).    [provider]  docusate sodium (COLACE) 100 MG capsule Take 100 mg by mouth daily.     [provider]  donepezil (ARICEPT) 5 MG tablet Take 5 mg by mouth daily.     [provider]  DULoxetine (CYMBALTA) 20 MG capsule Take 20 mg by mouth daily. 07/19/15   [provider]  hydroxypropyl methylcellulose / hypromellose (ISOPTO TEARS / GONIOVISC) 2.5 % ophthalmic solution Place 1 drop into both eyes every 4 (four) hours as needed for dry eyes.    [provider]  levothyroxine (SYNTHROID, LEVOTHROID) 25 MCG tablet Take 25 mcg by mouth daily.    [provider]  LORazepam (ATIVAN) 0.5 MG tablet Take 5 mg by mouth 2 (two) times daily. 07/28/15   [provider]  losartan (COZAAR) 50 MG tablet Take 50 mg by mouth daily.    [provider]  Meclizine HCl 25 MG CHEW Chew 25 mg by mouth 2 (two) times daily.    [provider]  meloxicam (MOBIC) 7.5 MG tablet Take 7.5 mg by mouth daily.    [provider]  neomycin-colistin-hydrocortisone-thonzonium (CORTISPORIN TC) 3.06-22-08-0.5 MG/ML otic suspension Place 4 drops into both ears 2 (two) times daily as needed (ear pain).  [provider]  omeprazole (PRILOSEC) 20 MG capsule Take 20 mg by mouth daily. 07/10/15   [provider]  Polyethyl Glycol-Propyl Glycol (SYSTANE PRESERVATIVE FREE) 0.4-0.3 % SOLN Apply 1 drop to eye 2 (two) times daily as needed (dry eyes).    [provider]  simvastatin (ZOCOR) 20 MG tablet Take 20 mg by mouth every evening.    [provider]  traMADol (ULTRAM) 50 MG tablet Take 50 mg by mouth 3 (three) times daily. 07/14/15   [provider]    Allergies    Latex  Review of Systems   Review of Systems  Gastrointestinal: Positive for abdominal pain.  All other systems reviewed and are  negative.   Physical Exam Updated Vital Signs BP 115/61   Pulse (!) 101   Temp (!) 103.3 F (39.6 C) (Rectal)   Resp (!) 23   Ht 5\' 5"  (1.651 m)   Wt 90 kg   SpO2 98%   BMI 33.02 kg/m   Physical Exam Vitals and nursing note reviewed.  Constitutional:      Comments: Chronically ill,  HENT:     Head: Normocephalic.  Eyes:     Extraocular Movements: Extraocular movements intact.  Cardiovascular:     Rate and Rhythm: Normal rate and regular rhythm.     Heart sounds: Normal heart sounds.  Pulmonary:     Effort: Pulmonary effort is normal.     Breath sounds: Normal breath sounds.  Abdominal:     General: Abdomen is flat.     Comments: Mild epigastric tenderness  Skin:    General: Skin is warm.  Neurological:     General: No focal deficit present.     Mental Status: She is oriented to person, place, and time.  Psychiatric:        Mood and Affect: Mood normal.        Behavior: Behavior normal.     ED Results / Procedures / Treatments   Labs (all labs ordered are listed, but only abnormal results are displayed) Labs Reviewed  URINALYSIS, ROUTINE W REFLEX MICROSCOPIC - Abnormal; Notable for the following components:      Result Value   Color, Urine AMBER (*)    APPearance HAZY (*)    Ketones, ur 20 (*)    Protein, ur 30 (*)    Bacteria, UA MANY (*)    All other components within normal limits  LACTIC ACID, PLASMA - Abnormal; Notable for the following components:   Lactic Acid, Venous 3.7 (*)    All other components within normal limits  COMPREHENSIVE METABOLIC PANEL - Abnormal; Notable for the following components:   Sodium 134 (*)    Glucose, Bld 153 (*)    Calcium 8.8 (*)    Total Protein 5.5 (*)    Albumin 2.9 (*)    AST 401 (*)    ALT 251 (*)    Alkaline Phosphatase 332 (*)    Total Bilirubin 2.0 (*)    All other components within normal limits  CBC WITH DIFFERENTIAL/PLATELET - Abnormal; Notable for the following components:   Hemoglobin 11.7 (*)     Platelets 131 (*)    Neutro Abs 8.5 (*)    Lymphs Abs 0.2 (*)    All other components within normal limits  APTT - Abnormal; Notable for the following components:   aPTT 23 (*)    All other components within normal limits  I-STAT VENOUS BLOOD GAS, ED - Abnormal; Notable for the  following components:   pH, Ven 7.447 (*)    pCO2, Ven 33.1 (*)    pO2, Ven 70.0 (*)    Calcium, Ion 1.11 (*)    HCT 34.0 (*)    Hemoglobin 11.6 (*)    All other components within normal limits  RESPIRATORY PANEL BY RT PCR (FLU A&B, COVID)  URINE CULTURE  CULTURE, BLOOD (ROUTINE X 2)  CULTURE, BLOOD (ROUTINE X 2)  MRSA PCR SCREENING  PROTIME-INR  LACTIC ACID, PLASMA  BLOOD GAS, VENOUS    EKG EKG Interpretation  Date/Time:  Wednesday February 16 2020 19:44:46 EDT Ventricular Rate:  104 PR Interval:    QRS Duration: 123 QT Interval:  354 QTC Calculation: 466 R Axis:   37 Text Interpretation: Sinus tachycardia Left bundle branch block Since last tracing rate faster Confirmed by Richardean Canal [phone]) on 02/16/2020 8:13:46 PM   Radiology DG Chest Port 1 View  Result Date: 02/16/2020 CLINICAL DATA:  Possible sepsis EXAM: PORTABLE CHEST 1 VIEW COMPARISON:  2013 FINDINGS: Rotated. Distorted thorax related to marked scoliosis. No definite new consolidation or edema. Chronic interstitial changes remain similar. No significant pleural effusion. No pneumothorax. Similar cardiomediastinal contours. IMPRESSION: No definite acute process in the chest. Chronic interstitial changes. Electronically Signed   By: Guadlupe Spanish M.D.   On: 02/16/2020 20:17    Procedures Procedures (including critical care time)  Medications Ordered in ED Medications  lactated ringers infusion ( Intravenous New Bag/Given 02/16/20 2023)  lactated ringers bolus 1,000 mL (0 mLs Intravenous Stopped 02/16/20 2102)    And  lactated ringers bolus 1,000 mL (1,000 mLs Intravenous New Bag/Given 02/16/20 2102)    And  lactated ringers  bolus 1,000 mL (1,000 mLs Intravenous New Bag/Given 02/16/20 2102)  metroNIDAZOLE (FLAGYL) IVPB 500 mg (500 mg Intravenous New Bag/Given 02/16/20 2101)  vancomycin (VANCOREADY) IVPB 2000 mg/400 mL (2,000 mg Intravenous New Bag/Given 02/16/20 2124)  vancomycin (VANCOREADY) IVPB 750 mg/150 mL (has no administration in time range)  ceFEPIme (MAXIPIME) 2 g in sodium chloride 0.9 % 100 mL IVPB (has no administration in time range)  ceFEPIme (MAXIPIME) 2 g in sodium chloride 0.9 % 100 mL IVPB (0 g Intravenous Stopped 02/16/20 2102)  acetaminophen (TYLENOL) tablet 650 mg (650 mg Oral Given 02/16/20 2031)    ED Course  I have reviewed the triage vital signs and the nursing notes.  Pertinent labs & imaging results that were available during my care of the patient were reviewed by me and considered in my medical decision making (see chart for details).    MDM Rules/Calculators/A&P                         Yavonne Kiss is a 86 y.o. female here presenting with abdominal pain and fever.  Patient is febrile 103.  She also is tachycardic.  Patient apparently was confused and has abdominal pain.  Patient had apneic episode but is tachypneic in the ED.  Code sepsis initiated.  Will get CBC, CMP, lactic acid, chest x-ray, urinalysis.  Will give 30 cc/kg bolus and give broad-spectrum IV antibiotics.  If her LFTs are elevated, plan to get CT abdomen pelvis as well.  10:53 PM White blood cell count is 9.  Her lactate is 3.6 however.  Chest x-ray and urinalysis are negative.  Received broad-spectrum antibiotics.  Her LFTs were elevated. CT ab/pel pending. Anticipate admission after CT. Signed out to Dr. Nicanor Alcon in the ED    Final Clinical  Impression(s) / ED Diagnoses Final diagnoses:  None    Rx / DC Orders ED Discharge Orders    None       Charlynne Pander, MD 02/16/20 2322

## 2020-02-16 NOTE — Progress Notes (Signed)
Pharmacy Antibiotic Note  Kristi Collins is a 86 y.o. female admitted on 02/16/2020 with sepsis of unknown source reports of abdominal pain and dental abscess.  Pharmacy has been consulted for vancomycin and cefepime dosing. Pt is also on flagyl per primary. This is day 1 of therapy. Pt is febrile Temp on admission 102F, WBC, LA pending.   Pt was recently seen ED for jaw swelling and oral surgery evaluation. At that time she had imaging done which showed no evidence of abscess. Pt was discharged on 10/8 with augmentin 875mg  BID x10 days  Plan: vancomycin 2000mg  IV X1 Vancomycin 750mg  IV q12h Cefepime 2g IV q12h Flagyl per primary Monitor clinical progress, renal function, CBC, micro data, and vanc trough at steady state as needed,  Height: 5\' 5"  (165.1 cm) Weight: 90 kg (198 lb 6.6 oz) IBW/kg (Calculated) : 57  No data recorded.  No results for input(s): WBC, CREATININE, LATICACIDVEN, VANCOTROUGH, VANCOPEAK, VANCORANDOM, GENTTROUGH, GENTPEAK, GENTRANDOM, TOBRATROUGH, TOBRAPEAK, TOBRARND, AMIKACINPEAK, AMIKACINTROU, AMIKACIN in the last 168 hours.  Estimated Creatinine Clearance: 49.1 mL/min (by C-G formula based on SCr of 0.81 mg/dL).    Allergies  Allergen Reactions  . Latex     Antimicrobials this admission: vanc 10/27> Cefepime 10/27> Flagyl x1 10/27  Microbiology results: 10/27 Bcx ordered 10/27 UA, UCX ordered 10/27 resp panel ordered 10/27 MRSA PCR ordered  Thank you for allowing pharmacy to be a part of this patient's care.  11/27  pgy1 pharmacy resident 02/16/2020 8:04 PM

## 2020-02-17 ENCOUNTER — Inpatient Hospital Stay (HOSPITAL_COMMUNITY): Payer: Medicare PPO

## 2020-02-17 ENCOUNTER — Emergency Department (HOSPITAL_COMMUNITY): Payer: Medicare PPO

## 2020-02-17 DIAGNOSIS — B9620 Unspecified Escherichia coli [E. coli] as the cause of diseases classified elsewhere: Secondary | ICD-10-CM | POA: Diagnosis present

## 2020-02-17 DIAGNOSIS — R4189 Other symptoms and signs involving cognitive functions and awareness: Secondary | ICD-10-CM | POA: Diagnosis present

## 2020-02-17 DIAGNOSIS — R509 Fever, unspecified: Secondary | ICD-10-CM | POA: Diagnosis present

## 2020-02-17 DIAGNOSIS — Z993 Dependence on wheelchair: Secondary | ICD-10-CM | POA: Diagnosis not present

## 2020-02-17 DIAGNOSIS — I10 Essential (primary) hypertension: Secondary | ICD-10-CM | POA: Diagnosis present

## 2020-02-17 DIAGNOSIS — Z79899 Other long term (current) drug therapy: Secondary | ICD-10-CM | POA: Diagnosis not present

## 2020-02-17 DIAGNOSIS — Z91040 Latex allergy status: Secondary | ICD-10-CM | POA: Diagnosis not present

## 2020-02-17 DIAGNOSIS — A419 Sepsis, unspecified organism: Principal | ICD-10-CM

## 2020-02-17 DIAGNOSIS — R748 Abnormal levels of other serum enzymes: Secondary | ICD-10-CM | POA: Diagnosis present

## 2020-02-17 DIAGNOSIS — K529 Noninfective gastroenteritis and colitis, unspecified: Secondary | ICD-10-CM | POA: Diagnosis present

## 2020-02-17 DIAGNOSIS — R7989 Other specified abnormal findings of blood chemistry: Secondary | ICD-10-CM | POA: Diagnosis not present

## 2020-02-17 DIAGNOSIS — E785 Hyperlipidemia, unspecified: Secondary | ICD-10-CM | POA: Diagnosis present

## 2020-02-17 DIAGNOSIS — E876 Hypokalemia: Secondary | ICD-10-CM | POA: Diagnosis present

## 2020-02-17 DIAGNOSIS — R7401 Elevation of levels of liver transaminase levels: Secondary | ICD-10-CM | POA: Diagnosis present

## 2020-02-17 DIAGNOSIS — M810 Age-related osteoporosis without current pathological fracture: Secondary | ICD-10-CM | POA: Diagnosis present

## 2020-02-17 DIAGNOSIS — E872 Acidosis: Secondary | ICD-10-CM | POA: Diagnosis present

## 2020-02-17 DIAGNOSIS — Z79891 Long term (current) use of opiate analgesic: Secondary | ICD-10-CM | POA: Diagnosis not present

## 2020-02-17 DIAGNOSIS — N390 Urinary tract infection, site not specified: Secondary | ICD-10-CM | POA: Diagnosis present

## 2020-02-17 DIAGNOSIS — Z66 Do not resuscitate: Secondary | ICD-10-CM | POA: Diagnosis present

## 2020-02-17 DIAGNOSIS — Z20822 Contact with and (suspected) exposure to covid-19: Secondary | ICD-10-CM | POA: Diagnosis present

## 2020-02-17 DIAGNOSIS — K047 Periapical abscess without sinus: Secondary | ICD-10-CM | POA: Diagnosis present

## 2020-02-17 DIAGNOSIS — Z9049 Acquired absence of other specified parts of digestive tract: Secondary | ICD-10-CM | POA: Diagnosis not present

## 2020-02-17 DIAGNOSIS — F0390 Unspecified dementia without behavioral disturbance: Secondary | ICD-10-CM

## 2020-02-17 DIAGNOSIS — Z7982 Long term (current) use of aspirin: Secondary | ICD-10-CM | POA: Diagnosis not present

## 2020-02-17 LAB — CBC
HCT: 35 % — ABNORMAL LOW (ref 36.0–46.0)
Hemoglobin: 11.2 g/dL — ABNORMAL LOW (ref 12.0–15.0)
MCH: 27.6 pg (ref 26.0–34.0)
MCHC: 32 g/dL (ref 30.0–36.0)
MCV: 86.2 fL (ref 80.0–100.0)
Platelets: 127 10*3/uL — ABNORMAL LOW (ref 150–400)
RBC: 4.06 MIL/uL (ref 3.87–5.11)
RDW: 15.2 % (ref 11.5–15.5)
WBC: 9.2 10*3/uL (ref 4.0–10.5)
nRBC: 0 % (ref 0.0–0.2)

## 2020-02-17 LAB — HEPATITIS PANEL, ACUTE
HCV Ab: NONREACTIVE
Hep A IgM: NONREACTIVE
Hep B C IgM: NONREACTIVE
Hepatitis B Surface Ag: NONREACTIVE

## 2020-02-17 LAB — LACTIC ACID, PLASMA
Lactic Acid, Venous: 2.3 mmol/L (ref 0.5–1.9)
Lactic Acid, Venous: 1.7 mmol/L (ref 0.5–1.9)

## 2020-02-17 LAB — CREATININE, SERUM
Creatinine, Ser: 0.67 mg/dL (ref 0.44–1.00)
GFR, Estimated: >60 mL/min (ref >60)

## 2020-02-17 MED ORDER — ACETAMINOPHEN 650 MG RE SUPP: 650.0000 mg | Freq: Four times a day (QID) | RECTAL | Status: DC | PRN

## 2020-02-17 MED ORDER — LACTATED RINGERS IV SOLN: INTRAVENOUS | Status: DC

## 2020-02-17 MED ORDER — ACETAMINOPHEN 325 MG PO TABS
650.0000 mg | ORAL_TABLET | Freq: Four times a day (QID) | ORAL | Status: DC | PRN
  Administered 2020-02-19 – 2020-02-21 (×3): 650 mg via ORAL
  Filled 2020-02-17 (×3): qty 2

## 2020-02-17 MED ORDER — DIVALPROEX SODIUM 125 MG PO CSDR
125.0000 mg | DELAYED_RELEASE_CAPSULE | Freq: Two times a day (BID) | ORAL | Status: DC
  Administered 2020-02-17 – 2020-02-21 (×9): 125 mg via ORAL
  Filled 2020-02-17 (×11): qty 1

## 2020-02-17 MED ORDER — ASPIRIN 81 MG PO CHEW
81.0000 mg | CHEWABLE_TABLET | Freq: Every day | ORAL | Status: DC
  Administered 2020-02-17 – 2020-02-21 (×5): 81 mg via ORAL
  Filled 2020-02-17 (×5): qty 1

## 2020-02-17 MED ORDER — IOHEXOL 300 MG/ML  SOLN
100.0000 mL | Freq: Once | INTRAMUSCULAR | Status: AC | PRN
  Administered 2020-02-17: 100 mL via INTRAVENOUS

## 2020-02-17 MED ORDER — ENOXAPARIN SODIUM 40 MG/0.4ML ~~LOC~~ SOLN
40.0000 mg | Freq: Every day | SUBCUTANEOUS | Status: DC
  Administered 2020-02-17 – 2020-02-21 (×5): 40 mg via SUBCUTANEOUS
  Filled 2020-02-17 (×5): qty 0.4

## 2020-02-17 MED ORDER — METRONIDAZOLE IN NACL 5-0.79 MG/ML-% IV SOLN
500.0000 mg | Freq: Three times a day (TID) | INTRAVENOUS | Status: DC
  Administered 2020-02-17 – 2020-02-21 (×13): 500 mg via INTRAVENOUS
  Filled 2020-02-17 (×13): qty 100

## 2020-02-17 MED ORDER — ONDANSETRON HCL 4 MG/2ML IJ SOLN: 4.0000 mg | Freq: Four times a day (QID) | INTRAMUSCULAR | Status: DC | PRN

## 2020-02-17 MED ORDER — ONDANSETRON HCL 4 MG PO TABS: 4.0000 mg | ORAL_TABLET | Freq: Four times a day (QID) | ORAL | Status: DC | PRN

## 2020-02-17 MED ORDER — DOCUSATE SODIUM 100 MG PO CAPS
100.0000 mg | ORAL_CAPSULE | Freq: Every day | ORAL | Status: DC
  Administered 2020-02-17 – 2020-02-21 (×5): 100 mg via ORAL
  Filled 2020-02-17 (×5): qty 1

## 2020-02-17 MED ORDER — SODIUM CHLORIDE 0.9 % IV SOLN: 2.0000 g | Freq: Once | INTRAVENOUS | Status: DC

## 2020-02-17 NOTE — ED Notes (Signed)
Pt to [place] in stable condition via stretcher

## 2020-02-17 NOTE — ED Notes (Signed)
Pt returned to room  

## 2020-02-17 NOTE — Sepsis Progress Note (Signed)
Notified provider of need to order repeat lactic acid. ° °

## 2020-02-17 NOTE — H&P (Signed)
History and Physical    Kristi Collins YIR:[id number] DOB: August 10, 1925 DOA: 02/16/2020  PCP: Patient, No Pcp Per  Patient coming from: SNF, [facility]  I have personally briefly reviewed patient's old medical records in [facility]  Chief Complaint: fever, abd pain, vomiting  HPI: Kristi Collins is a 86 y.o. female with medical history significant of dementia, hypertension, hyperlipidemia, who is a resident of a nursing facility.  Family reports that she is mostly wheelchair-bound.  Patient is unable to provide any reliable history due to cognitive deficits.  History is obtained from the medical record and by speaking to patient's family.  According to family, they were informed that yesterday, patient developed fever, vomiting and abdominal pain.  No diarrhea was reported.  She did not have any cough or shortness of breath.  When her fever was persistent, she was sent to the ER for evaluation.  ED Course: On arrival to the emergency room, she was noted to have a temperature of 103F.  She was tachycardic, had intermittent hypotension.  Lactic acid was significantly elevated at 3.7.  AST ALT elevated at 401/251.  Bilirubin of 2.0.  WBC count was normal.  Urinalysis did not show any signs of infection.  Viral hepatitis panel negative.  Coronavirus test is negative.  CT of the abdomen and pelvis indicated possible proctocolitis with moderate stool burden in rectum.  Patient has been referred for admission.  Review of Systems: cannot assess due to dementia   Past Medical History:  Diagnosis Date  . Hypercholesteremia   . Hypertension   . Osteoporosis   . Thyroid disease     No past surgical history on file.  Social History:  reports that she has never smoked. She has never used smokeless tobacco. She reports that she does not drink alcohol and does not use drugs.  Allergies  Allergen Reactions  . Latex     Family history: cannot assess due to dementia  Prior to Admission  medications   Medication Sig Start Date End Date Taking? Authorizing Provider  aspirin 81 MG chewable tablet Chew 81 mg by mouth daily.   Yes [provider]  docusate sodium (COLACE) 100 MG capsule Take 100 mg by mouth daily.    Yes [provider]  hydroxypropyl methylcellulose / hypromellose (ISOPTO TEARS / GONIOVISC) 2.5 % ophthalmic solution Place 1 drop into both eyes every 4 (four) hours as needed for dry eyes.   Yes [provider]  losartan (COZAAR) 50 MG tablet Take 50 mg by mouth daily.   Yes [provider]  simvastatin (ZOCOR) 20 MG tablet Take 20 mg by mouth every evening.   Yes [provider]  traMADol (ULTRAM) 50 MG tablet Take 50 mg by mouth 3 (three) times daily. 07/14/15  Yes [provider]  divalproex (DEPAKOTE SPRINKLE) 125 MG capsule Take 125 mg by mouth 2 (two) times daily. 01/17/20   [provider]    Physical Exam: Vitals:   02/17/20 0800 02/17/20 0900 02/17/20 1000 02/17/20 1100  BP: 129/61 126/65 (!) 118/59 126/60  Pulse: 70 70 66 67  Resp: (!) 22 17 (!) 22 20  Temp:      TempSrc:      SpO2: 100% 100% 99% 100%  Weight:      Height:        Constitutional: NAD, calm, comfortable Eyes: PERRL, lids and conjunctivae normal ENMT: Mucous membranes are moist. Posterior pharynx clear of any exudate or lesions.Normal dentition.  Neck: normal, supple,  no masses, no thyromegaly Respiratory: clear to auscultation bilaterally, no wheezing, no crackles. Normal respiratory effort. No accessory muscle use.  Cardiovascular: Regular rate and rhythm, no murmurs / rubs / gallops. No extremity edema. 2+ pedal pulses. No carotid bruits.  Abdomen: no tenderness, no masses palpated. No hepatosplenomegaly. Bowel sounds positive.  Musculoskeletal: no clubbing / cyanosis. No joint deformity upper and lower extremities. Good ROM, no contractures. Normal muscle tone.  Skin: no rashes, lesions, ulcers. No  induration Neurologic: CN 2-12 grossly intact. Sensation intact, DTR normal. Strength 5/5 in all 4.  Psychiatric: confused, pleasant.    Labs on Admission: I have personally reviewed following labs and imaging studies  CBC: Recent Labs  Lab 02/16/20 1957 02/16/20 2031  WBC 9.6  --   NEUTROABS 8.5*  --   HGB 11.7* 11.6*  HCT 36.1 34.0*  MCV 85.5  --   PLT 131*  --    Basic Metabolic Panel: Recent Labs  Lab 02/16/20 1957 02/16/20 2031  NA 134* 135  K 3.9 3.9  CL 101  --   CO2 22  --   GLUCOSE 153*  --   BUN 18  --   CREATININE 0.86  --   CALCIUM 8.8*  --    GFR: Estimated Creatinine Clearance: 46.3 mL/min (by C-G formula based on SCr of 0.86 mg/dL). Liver Function Tests: Recent Labs  Lab 02/16/20 1957  AST 401*  ALT 251*  ALKPHOS 332*  BILITOT 2.0*  PROT 5.5*  ALBUMIN 2.9*   No results for input(s): LIPASE, AMYLASE in the last 168 hours. No results for input(s): AMMONIA in the last 168 hours. Coagulation Profile: Recent Labs  Lab 02/16/20 1957  INR 1.0   Cardiac Enzymes: No results for input(s): CKTOTAL, CKMB, CKMBINDEX, TROPONINI in the last 168 hours. BNP (last 3 results) No results for input(s): PROBNP in the last 8760 hours. HbA1C: No results for input(s): HGBA1C in the last 72 hours. CBG: No results for input(s): GLUCAP in the last 168 hours. Lipid Profile: No results for input(s): CHOL, HDL, LDLCALC, TRIG, CHOLHDL, LDLDIRECT in the last 72 hours. Thyroid Function Tests: No results for input(s): TSH, T4TOTAL, FREET4, T3FREE, THYROIDAB in the last 72 hours. Anemia Panel: No results for input(s): VITAMINB12, FOLATE, FERRITIN, TIBC, IRON, RETICCTPCT in the last 72 hours. Urine analysis:    Component Value Date/Time   COLORURINE AMBER (A) 02/16/2020 2000   APPEARANCEUR HAZY (A) 02/16/2020 2000   LABSPEC 1.021 02/16/2020 2000   PHURINE 5.0 02/16/2020 2000   GLUCOSEU NEGATIVE 02/16/2020 2000   HGBUR NEGATIVE 02/16/2020 2000   BILIRUBINUR  NEGATIVE 02/16/2020 2000   KETONESUR 20 (A) 02/16/2020 2000   PROTEINUR 30 (A) 02/16/2020 2000   UROBILINOGEN 1.0 07/01/2011 1153   NITRITE NEGATIVE 02/16/2020 2000   LEUKOCYTESUR NEGATIVE 02/16/2020 2000    Radiological Exams on Admission: CT ABDOMEN PELVIS W CONTRAST  Result Date: 02/17/2020 CLINICAL DATA:  Abdominal pain EXAM: CT ABDOMEN AND PELVIS WITH CONTRAST TECHNIQUE: Multidetector CT imaging of the abdomen and pelvis was performed using the standard protocol following bolus administration of intravenous contrast. CONTRAST:  OMNIPAQUE IOHEXOL 300 MG/ML  SOLN COMPARISON:  Sep 03, 2019 FINDINGS: Lower chest: The visualized heart size within normal limits. No pericardial fluid/thickening. Patchy airspace opacity seen at the posterior left lung base. Hepatobiliary: Heterogeneous low-density seen throughout the liver parenchyma.The main portal vein is patent. The patient is status post cholecystectomy. No biliary ductal dilation. Pancreas: Unremarkable. No pancreatic ductal dilatation or surrounding inflammatory changes.  Spleen: Normal in size without focal abnormality. Adrenals/Urinary Tract: Both adrenal glands appear normal. The kidneys and collecting system appear normal without evidence of urinary tract calculus or hydronephrosis. Bladder is unremarkable. Stomach/Bowel: There is a small hiatal hernia present. The small bowel is unremarkable. There is scattered colonic diverticula throughout. There is a moderate amount of colonic stool seen within the rectum with apparent diffuse mild wall thickening and presacral fat stranding changes. Vascular/Lymphatic: There are no enlarged mesenteric, retroperitoneal, or pelvic lymph nodes. Scattered aortic atherosclerotic calcifications are seen without aneurysmal dilatation. Reproductive: There is fluid seen within the endometrial canal with a heterogeneous partially calcified uterine fibroid seen posteriorly. Other: No evidence of abdominal wall mass  or hernia. Musculoskeletal: No acute or significant osseous findings. A healed left inferior pubic rami fracture is seen. A S-shaped scoliotic curvature of the thoracolumbar spine is noted. IMPRESSION: Mildly dilated stool-filled rectum with findings that could be suggestive of proctocolitis. Fluid-filled distended endometrial canal which is nonspecific, new since the prior exam of Sep 03, 2019. If further evaluation is required would recommend pelvic ultrasound. Small hiatal hernia Aortic Atherosclerosis (ICD10-I70.0). Electronically Signed   By: Jonna Clark M.D.   On: 02/17/2020 01:06   DG Chest Port 1 View  Result Date: 02/16/2020 CLINICAL DATA:  Possible sepsis EXAM: PORTABLE CHEST 1 VIEW COMPARISON:  2013 FINDINGS: Rotated. Distorted thorax related to marked scoliosis. No definite new consolidation or edema. Chronic interstitial changes remain similar. No significant pleural effusion. No pneumothorax. Similar cardiomediastinal contours. IMPRESSION: No definite acute process in the chest. Chronic interstitial changes. Electronically Signed   By: Guadlupe Spanish M.D.   On: 02/16/2020 20:17    EKG: Independently reviewed. Sinus tachycardia with LBBB(old)  Assessment/Plan Active Problems:   Colitis   Dementia (HCC)   HTN (hypertension)   Hyperlipidemia   Elevated LFTs   Sepsis (HCC)     Sepsis -Patient presented with fever, tachycardia, lactic acidosis. -Source is felt to be intra-abdominal, possible colitis -She was started on intravenous antibiotics -She received fluid bolus per sepsis protocol -Lactate is not trending down -Hemodynamics appear to be stabilizing -Continue to monitor  Colitis -Noted on CT scan -No reported history of diarrhea per nursing facility -She was having vomiting and abdominal pain -If she does develop diarrhea, can send stool studies -Continue IV antibiotics  Elevated liver function tests -No obvious findings on CT abdomen -Check right upper quadrant  ultrasound  Recent dental infection -POA reports that patient has been receiving oral antibiotics for the past few weeks -She is scheduled to see an oral surgeon, Dr. Kenney Houseman on 11/3 -We will repeat CT maxillofacial to rule out any developing abscess -She does not have any clear physical exam findings to suggest abscess  Hypertension -Chronically on ARB -Hold for now in the setting of sepsis  Dementia -Currently calm and cooperative -Continue on Depakote  Hyperlipidemia -Hold statin in light of elevated LFTs  Generalized weakness -Chronically at skilled facility -Family reports that at baseline she is mostly wheelchair-bound  DVT prophylaxis: lovenox  Code Status: DNR  Family Communication: discussed with niece Marylene Land [phone] who is also patient POA  Disposition Plan: return to SNF when improved  Consults called:   Admission status: inpatient, telemetry   Erick Blinks MD [facility]   If 7PM-7AM, please contact night-coverage [email or web address]   02/17/2020, 12:03 PM

## 2020-02-18 ENCOUNTER — Inpatient Hospital Stay (HOSPITAL_COMMUNITY): Payer: Medicare PPO

## 2020-02-18 DIAGNOSIS — K759 Inflammatory liver disease, unspecified: Secondary | ICD-10-CM

## 2020-02-18 LAB — COMPREHENSIVE METABOLIC PANEL
ALT: 133 U/L — ABNORMAL HIGH (ref 0–44)
AST: 109 U/L — ABNORMAL HIGH (ref 15–41)
Albumin: 2.2 g/dL — ABNORMAL LOW (ref 3.5–5.0)
Alkaline Phosphatase: 207 U/L — ABNORMAL HIGH (ref 38–126)
Anion gap: 8 (ref 5–15)
BUN: 10 mg/dL (ref 8–23)
CO2: 22 mmol/L (ref 22–32)
Calcium: 8.5 mg/dL — ABNORMAL LOW (ref 8.9–10.3)
Chloride: 107 mmol/L (ref 98–111)
Creatinine, Ser: 0.56 mg/dL (ref 0.44–1.00)
GFR, Estimated: >60 mL/min (ref >60)
Glucose, Bld: 109 mg/dL — ABNORMAL HIGH (ref 70–99)
Potassium: 3.4 mmol/L — ABNORMAL LOW (ref 3.5–5.1)
Sodium: 137 mmol/L (ref 135–145)
Total Bilirubin: 2.5 mg/dL — ABNORMAL HIGH (ref 0.3–1.2)
Total Protein: 4.4 g/dL — ABNORMAL LOW (ref 6.5–8.1)

## 2020-02-18 LAB — CBC
HCT: 32.1 % — ABNORMAL LOW (ref 36.0–46.0)
Hemoglobin: 10.7 g/dL — ABNORMAL LOW (ref 12.0–15.0)
MCH: 28.1 pg (ref 26.0–34.0)
MCHC: 33.3 g/dL (ref 30.0–36.0)
MCV: 84.3 fL (ref 80.0–100.0)
Platelets: 118 10*3/uL — ABNORMAL LOW (ref 150–400)
RBC: 3.81 MIL/uL — ABNORMAL LOW (ref 3.87–5.11)
RDW: 15.2 % (ref 11.5–15.5)
WBC: 6.5 10*3/uL (ref 4.0–10.5)
nRBC: 0 % (ref 0.0–0.2)

## 2020-02-18 LAB — PROTIME-INR
INR: 1.1 (ref 0.8–1.2)
Prothrombin Time: 13.8 seconds (ref 11.4–15.2)

## 2020-02-18 LAB — LACTIC ACID, PLASMA: Lactic Acid, Venous: 1.3 mmol/L (ref 0.5–1.9)

## 2020-02-18 LAB — CORTISOL-AM, BLOOD: Cortisol - AM: 16.6 ug/dL (ref 6.7–22.6)

## 2020-02-18 LAB — PROCALCITONIN: Procalcitonin: 3.53 ng/mL

## 2020-02-18 MED ORDER — IOHEXOL 300 MG/ML  SOLN
75.0000 mL | Freq: Once | INTRAMUSCULAR | Status: AC | PRN
  Administered 2020-02-18: 75 mL via INTRAVENOUS

## 2020-02-18 MED ORDER — POTASSIUM CHLORIDE CRYS ER 20 MEQ PO TBCR
40.0000 meq | EXTENDED_RELEASE_TABLET | Freq: Once | ORAL | Status: DC
  Filled 2020-02-18: qty 2

## 2020-02-18 NOTE — Progress Notes (Signed)
Patient returned to room at this time.

## 2020-02-18 NOTE — Progress Notes (Signed)
Patient taken down for scan at this time with transport.

## 2020-02-18 NOTE — Progress Notes (Signed)
[facility]  PROGRESS NOTE  Kristi Collins GYF:[id number] DOB: [birth year] DOA: 02/16/2020 PCP: Patient, No Pcp Per   Brief HPI:   86 year old female with history of dementia, hypertension, hyperlipidemia who is currently resident of nursing facility.  Patient evidently wheelchair-bound as per the family.  As per family patient without fever, vomiting abdominal pain.  Fever was persistent.  Lactic acid was elevated 3.7, AST/ALT elevated at 401/251.  WBC count was normal.  CT of the abdomen pelvis indicated possible proctocolitis moderate stool burden.    Subjective   Patient seen, pleasantly confused.   Assessment/Plan:     1. Sepsis-patient presented with fever, tachycardia, lactic acidosis.  Abdominal CT shows possible colitis.  Also shows fluid-filled distended endometrial canal which is nonspecific.  CT maxillofacial did not show any dental abscess.  Continue empiric antibiotics cefepime and Flagyl. 2. Endometrial canal fluid-CT abdomen pelvis shows fluid-filled endometrial canal which is nonspecific, will obtain pelvic ultrasound for further evaluation. 3. Colitis-seen on CT abdomen.  No previous history of diarrhea.  Started on Flagyl and cefepime as above. 4. Transaminitis-patient presented with significant elevated AST and ALT of 401/251, it has currently improved to 109/133.  Alk phos is also down to 207, abdominal ultrasound shows  heterogeneous liver texture, hepatocellular disease versus hepatic steatosis.  Status post cholecystectomy, dilation of CBD common finding.  Follow LFTs in a.m.  Likely in the setting of sepsis. 5. Recent dental infection-patient's HC POA reported that patient was receiving oral antibiotic for past 2 weeks, she is supposed to follow-up with oral surgeon on 02/23/2020.  CT maxillofacial was done which was negative for any abscess. 6. Hypertension-patient takes ARB at home which is currently on hold in setting of sepsis. 7. Dementia-currently no bladder  disturbance, continue Depakote. 8. Hyperlipidemia-statin on hold due to elevated LFTs. 9. Generalized weakness-patient is chronically wheelchair-bound.     COVID-19 Labs  No results for input(s): DDIMER, FERRITIN, LDH, CRP in the last 72 hours.  Lab Results  Component Value Date   SARSCOV2NAA NEGATIVE 02/16/2020   [place] NEGATIVE 01/28/2020     Scheduled medications:   . aspirin  81 mg Oral Daily  . divalproex  125 mg Oral BID  . docusate sodium  100 mg Oral Daily  . enoxaparin (LOVENOX) injection  40 mg Subcutaneous Daily         CBG: No results for input(s): GLUCAP in the last 168 hours.  SpO2: 100 %    CBC: Recent Labs  Lab 02/16/20 1957 02/16/20 2031 02/17/20 1821 02/18/20 0438  WBC 9.6  --  9.2 6.5  NEUTROABS 8.5*  --   --   --   HGB 11.7* 11.6* 11.2* 10.7*  HCT 36.1 34.0* 35.0* 32.1*  MCV 85.5  --  86.2 84.3  PLT 131*  --  127* 118*    Basic Metabolic Panel: Recent Labs  Lab 02/16/20 1957 02/16/20 2031 02/17/20 1821 02/18/20 0438  NA 134* 135  --  137  K 3.9 3.9  --  3.4*  CL 101  --   --  107  CO2 22  --   --  22  GLUCOSE 153*  --   --  109*  BUN 18  --   --  10  CREATININE 0.86  --  0.67 0.56  CALCIUM 8.8*  --   --  8.5*     Liver Function Tests: Recent Labs  Lab 02/16/20 1957 02/18/20 0438  AST 401* 109*  ALT 251* 133*  ALKPHOS  332* 207*  BILITOT 2.0* 2.5*  PROT 5.5* 4.4*  ALBUMIN 2.9* 2.2*     Antibiotics: Anti-infectives (From admission, onward)   Start     Dose/Rate Route Frequency Ordered Stop   02/17/20 1300  metroNIDAZOLE (FLAGYL) IVPB 500 mg        500 mg 100 mL/hr over 60 Minutes Intravenous Every 8 hours 02/17/20 1202     02/17/20 1215  ceFEPIme (MAXIPIME) 2 g in sodium chloride 0.9 % 100 mL IVPB  Status:  Discontinued        2 g 200 mL/hr over 30 Minutes Intravenous  Once 02/17/20 1202 02/17/20 1206   02/17/20 0800  vancomycin (VANCOREADY) IVPB 750 mg/150 mL  Status:  Discontinued        750 mg 150  mL/hr over 60 Minutes Intravenous Every 12 hours 02/16/20 2016 02/17/20 1202   02/17/20 0800  ceFEPIme (MAXIPIME) 2 g in sodium chloride 0.9 % 100 mL IVPB        2 g 200 mL/hr over 30 Minutes Intravenous Every 12 hours 02/16/20 2016     02/16/20 2015  vancomycin (VANCOREADY) IVPB 2000 mg/400 mL        2,000 mg 200 mL/hr over 120 Minutes Intravenous  Once 02/16/20 2004 02/17/20 0151   02/16/20 2000  ceFEPIme (MAXIPIME) 2 g in sodium chloride 0.9 % 100 mL IVPB        2 g 200 mL/hr over 30 Minutes Intravenous  Once 02/16/20 1957 02/16/20 2102   02/16/20 2000  metroNIDAZOLE (FLAGYL) IVPB 500 mg        500 mg 100 mL/hr over 60 Minutes Intravenous  Once 02/16/20 1957 02/16/20 2326   02/16/20 2000  vancomycin (VANCOCIN) IVPB 1000 mg/200 mL premix  Status:  Discontinued        1,000 mg 200 mL/hr over 60 Minutes Intravenous  Once 02/16/20 1957 02/16/20 2004       DVT prophylaxis: Lovenox  Code Status: DNR  Family Communication: No family at bedside   Consultants:    Procedures:      Objective   Vitals:   02/17/20 1525 02/17/20 2049 02/18/20 0500 02/18/20 1513  BP: 104/80 (!) 118/52 (!) 107/53 (!) 145/74  Pulse:  (!) 58 67 66  Resp: 19 17 17 19   Temp:  97.7 F (36.5 C) 98.1 F (36.7 C) 97.7 F (36.5 C)  TempSrc:  Oral Axillary Oral  SpO2:  98% 99% 100%  Weight:      Height:        Intake/Output Summary (Last 24 hours) at 02/18/2020 1534 Last data filed at 02/18/2020 1330 Gross per 24 hour  Intake 1954.41 ml  Output --  Net 1954.41 ml    10/27 1901 - 10/29 0700 In: 1834.4 [I.V.:1431.3] Out: -   Filed Weights   02/16/20 1939  Weight: 90 kg    Physical Examination:    General-appears in no acute distress  Heart-S1-S2, regular, no murmur auscultated  Lungs-clear to auscultation bilaterally, no wheezing or crackles auscultated  Abdomen-soft, nontender, no organomegaly  Extremities-no edema in the lower extremities  Neuro-alert, pleasantly  confused   Status is: Inpatient  Dispo: The patient is from: Skilled nursing facility              Anticipated d/c is to: Skilled nursing facility              Anticipated d/c date is: 02/21/2020              Patient  currently not medically stable for discharge  Barrier to discharge-ongoing treatment for sepsis            Data Reviewed:   Recent Results (from the past 240 hour(s))  Urine Culture     Status: Abnormal (Preliminary result)   Collection Time: 02/16/20  7:56 PM   Specimen: Urine, Random  Result Value Ref Range Status   Specimen Description URINE, RANDOM  Final   Special Requests NONE  Final   Culture (A)  Final    >=100,000 COLONIES/mL ESCHERICHIA COLI SUSCEPTIBILITIES TO FOLLOW Performed at [facility], [address] [place], [place] [address]    Report Status PENDING  Incomplete  Blood Culture (routine x 2)     Status: None (Preliminary result)   Collection Time: 02/16/20  8:00 PM   Specimen: BLOOD  Result Value Ref Range Status   Specimen Description BLOOD SITE NOT SPECIFIED  Final   Special Requests   Final    BOTTLES DRAWN AEROBIC AND ANAEROBIC Blood Culture adequate volume   Culture   Final    NO GROWTH 2 DAYS Performed at [facility], [place] [address] [place], [place] [address]    Report Status PENDING  Incomplete  Blood Culture (routine x 2)     Status: None (Preliminary result)   Collection Time: 02/16/20  8:10 PM   Specimen: BLOOD  Result Value Ref Range Status   Specimen Description BLOOD SITE NOT SPECIFIED  Final   Special Requests   Final    BOTTLES DRAWN AEROBIC AND ANAEROBIC Blood Culture adequate volume   Culture   Final    NO GROWTH 2 DAYS Performed at [facility], [place] [address], [place], [place] [address]    Report Status PENDING  Incomplete  Respiratory Panel by RT PCR (Flu A&B, Covid) - Nasopharyngeal Swab     Status: None   Collection Time: 02/16/20  8:10 PM   Specimen: Nasopharyngeal Swab  Result  Value Ref Range Status   SARS Coronavirus 2 by RT PCR NEGATIVE NEGATIVE Final    Comment: (NOTE) SARS-CoV-2 target nucleic acids are NOT DETECTED.  The SARS-CoV-2 RNA is generally detectable in upper respiratoy specimens during the acute phase of infection. The lowest concentration of SARS-CoV-2 viral copies this assay can detect is 131 copies/mL. A negative result does not preclude SARS-Cov-2 infection and should not be used as the sole basis for treatment or other patient management decisions. A negative result may occur with  improper specimen collection/handling, submission of specimen other than nasopharyngeal swab, presence of viral mutation(s) within the areas targeted by this assay, and inadequate number of viral copies (<131 copies/mL). A negative result must be combined with clinical observations, patient history, and epidemiological information. The expected result is Negative.  Fact Sheet for Patients:  PinkCheek.be  Fact Sheet for Healthcare Providers:  GravelBags.it  This test is no t yet approved or cleared by the [place] FDA and  has been authorized for detection and/or diagnosis of SARS-CoV-2 by FDA under an Emergency Use Authorization (EUA). This EUA will remain  in effect (meaning this test can be used) for the duration of the COVID-19 declaration under Section 564(b)(1) of the Act, 21 U.S.C. section 360bbb-3(b)(1), unless the authorization is terminated or revoked sooner.     Influenza A by PCR NEGATIVE NEGATIVE Final   Influenza B by PCR NEGATIVE NEGATIVE Final    Comment: (NOTE) The Xpert Xpress SARS-CoV-2/FLU/RSV assay is intended as an aid  in  the diagnosis of influenza from Nasopharyngeal swab specimens and  should not be used as a sole basis for treatment. Nasal washings and  aspirates are unacceptable for Xpert Xpress SARS-CoV-2/FLU/RSV  testing.  Fact Sheet for  Patients: PinkCheek.be  Fact Sheet for Healthcare Providers: GravelBags.it  This test is not yet approved or cleared by the [place] FDA and  has been authorized for detection and/or diagnosis of SARS-CoV-2 by  FDA under an Emergency Use Authorization (EUA). This EUA will remain  in effect (meaning this test can be used) for the duration of the  Covid-19 declaration under Section 564(b)(1) of the Act, 21  U.S.C. section 360bbb-3(b)(1), unless the authorization is  terminated or revoked. Performed at [facility], [place] [address], [place], [place] [address]   Blood culture (routine x 2)     Status: None (Preliminary result)   Collection Time: 02/17/20  3:18 AM   Specimen: BLOOD  Result Value Ref Range Status   Specimen Description BLOOD SITE NOT SPECIFIED  Final   Special Requests   Final    BOTTLES DRAWN AEROBIC AND ANAEROBIC Blood Culture results may not be optimal due to an inadequate volume of blood received in culture bottles   Culture   Final    NO GROWTH 1 DAY Performed at [facility], [place] [address] [place], [place] [address]    Report Status PENDING  Incomplete    No results for input(s): LIPASE, AMYLASE in the last 168 hours. No results for input(s): AMMONIA in the last 168 hours.  Cardiac Enzymes: No results for input(s): CKTOTAL, CKMB, CKMBINDEX, TROPONINI in the last 168 hours. BNP (last 3 results) No results for input(s): BNP in the last 8760 hours.  ProBNP (last 3 results) No results for input(s): PROBNP in the last 8760 hours.  Studies:  CT ABDOMEN PELVIS W CONTRAST  Result Date: 02/17/2020 CLINICAL DATA:  Abdominal pain EXAM: CT ABDOMEN AND PELVIS WITH CONTRAST TECHNIQUE: Multidetector CT imaging of the abdomen and pelvis was performed using the standard protocol following bolus administration of intravenous contrast. CONTRAST:  167m OMNIPAQUE IOHEXOL 300 MG/ML  SOLN COMPARISON:   Sep 03, 2019 FINDINGS: Lower chest: The visualized heart size within normal limits. No pericardial fluid/thickening. Patchy airspace opacity seen at the posterior left lung base. Hepatobiliary: Heterogeneous low-density seen throughout the liver parenchyma.The main portal vein is patent. The patient is status post cholecystectomy. No biliary ductal dilation. Pancreas: Unremarkable. No pancreatic ductal dilatation or surrounding inflammatory changes. Spleen: Normal in size without focal abnormality. Adrenals/Urinary Tract: Both adrenal glands appear normal. The kidneys and collecting system appear normal without evidence of urinary tract calculus or hydronephrosis. Bladder is unremarkable. Stomach/Bowel: There is a small hiatal hernia present. The small bowel is unremarkable. There is scattered colonic diverticula throughout. There is a moderate amount of colonic stool seen within the rectum with apparent diffuse mild wall thickening and presacral fat stranding changes. Vascular/Lymphatic: There are no enlarged mesenteric, retroperitoneal, or pelvic lymph nodes. Scattered aortic atherosclerotic calcifications are seen without aneurysmal dilatation. Reproductive: There is fluid seen within the endometrial canal with a heterogeneous partially calcified uterine fibroid seen posteriorly. Other: No evidence of abdominal wall mass or hernia. Musculoskeletal: No acute or significant osseous findings. A healed left inferior pubic rami fracture is seen. A S-shaped scoliotic curvature of the thoracolumbar spine is noted. IMPRESSION: Mildly dilated stool-filled rectum with findings that could be suggestive of proctocolitis. Fluid-filled distended endometrial canal which is nonspecific, new since  the prior exam of Sep 03, 2019. If further evaluation is required would recommend pelvic ultrasound. Small hiatal hernia Aortic Atherosclerosis (ICD10-I70.0). Electronically Signed   By: Prudencio Pair M.D.   On: 02/17/2020 01:06   CT  MAXILLOFACIAL W CONTRAST  Result Date: 02/18/2020 CLINICAL DATA:  Facial pain EXAM: CT MAXILLOFACIAL WITH CONTRAST TECHNIQUE: Multidetector CT imaging of the maxillofacial structures was performed with intravenous contrast. Multiplanar CT image reconstructions were also generated. CONTRAST:  36m OMNIPAQUE IOHEXOL 300 MG/ML  SOLN COMPARISON:  None. FINDINGS: Osseous: Postsurgical changes of remote left pterional craniotomy. No acute fracture. Orbits: The globes are intact. Normal appearance of the intra- and extraconal fat. Symmetric extraocular muscles. Sinuses: No fluid levels or advanced mucosal thickening. Soft tissues: Normal visualized extracranial soft tissues. 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 IMPRESSION: 1. No acute abnormality of the face. 2. 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 Electronically Signed   By: KUlyses JarredM.D.   On: 02/18/2020 02:50   DG Chest Port 1 View  Result Date: 02/16/2020 CLINICAL DATA:  Possible sepsis EXAM: PORTABLE CHEST 1 VIEW COMPARISON:  2013 FINDINGS: Rotated. Distorted thorax related to marked scoliosis. No definite new consolidation or edema. Chronic interstitial changes remain similar. No significant pleural effusion. No pneumothorax. Similar cardiomediastinal contours. IMPRESSION: No definite acute process in the chest. Chronic interstitial changes. Electronically Signed   By: PMacy MisM.D.   On: 02/16/2020 20:17   UKoreaAbdomen Limited RUQ (LIVER/GB)  Result Date: 02/17/2020 CLINICAL DATA:  Elevated LFTs.  Prior cholecystectomy. EXAM: ULTRASOUND ABDOMEN LIMITED RIGHT UPPER QUADRANT COMPARISON:  CT abdomen 02/17/2020 FINDINGS: Gallbladder: Surgically absent Common bile duct: Diameter: Mildly dilated 7 mm. Liver: No focal lesion identified. No intrahepatic duct dilatation. Heterogeneous liver echotexture. Portal vein is patent on color Doppler imaging with  normal direction of blood flow towards the liver. Other: No ascites IMPRESSION: 1. Heterogeneous liver echotexture. Differential would include hepatocellular disease versus hepatic steatosis. 2. Postcholecystectomy dilatation of the common bile duct is common finding. Electronically Signed   By: SSuzy BouchardM.D.   On: 02/17/2020 12:58       [place]  [facility] If 7PM-7AM, please contact night-coverage at [email or web address], Office  [phone]  02/18/2020, 3:34 PM  LOS: 1 day

## 2020-02-19 DIAGNOSIS — N390 Urinary tract infection, site not specified: Secondary | ICD-10-CM

## 2020-02-19 LAB — COMPREHENSIVE METABOLIC PANEL
ALT: 112 U/L — ABNORMAL HIGH (ref 0–44)
AST: 75 U/L — ABNORMAL HIGH (ref 15–41)
Albumin: 2.4 g/dL — ABNORMAL LOW (ref 3.5–5.0)
Alkaline Phosphatase: 197 U/L — ABNORMAL HIGH (ref 38–126)
Anion gap: 7 (ref 5–15)
BUN: 6 mg/dL — ABNORMAL LOW (ref 8–23)
CO2: 24 mmol/L (ref 22–32)
Calcium: 8.5 mg/dL — ABNORMAL LOW (ref 8.9–10.3)
Chloride: 106 mmol/L (ref 98–111)
Creatinine, Ser: 0.57 mg/dL (ref 0.44–1.00)
GFR, Estimated: >60 mL/min (ref >60)
Glucose, Bld: 108 mg/dL — ABNORMAL HIGH (ref 70–99)
Potassium: 3.2 mmol/L — ABNORMAL LOW (ref 3.5–5.1)
Sodium: 137 mmol/L (ref 135–145)
Total Bilirubin: 1.2 mg/dL (ref 0.3–1.2)
Total Protein: 4.5 g/dL — ABNORMAL LOW (ref 6.5–8.1)

## 2020-02-19 LAB — CBC
HCT: 33.8 % — ABNORMAL LOW (ref 36.0–46.0)
Hemoglobin: 11 g/dL — ABNORMAL LOW (ref 12.0–15.0)
MCH: 27.8 pg (ref 26.0–34.0)
MCHC: 32.5 g/dL (ref 30.0–36.0)
MCV: 85.6 fL (ref 80.0–100.0)
Platelets: 147 10*3/uL — ABNORMAL LOW (ref 150–400)
RBC: 3.95 MIL/uL (ref 3.87–5.11)
RDW: 15.2 % (ref 11.5–15.5)
WBC: 6.3 10*3/uL (ref 4.0–10.5)
nRBC: 0 % (ref 0.0–0.2)

## 2020-02-19 LAB — URINE CULTURE: Culture: >=100000 — AB

## 2020-02-19 MED ORDER — POTASSIUM CHLORIDE CRYS ER 20 MEQ PO TBCR
40.0000 meq | EXTENDED_RELEASE_TABLET | Freq: Two times a day (BID) | ORAL | Status: AC
  Administered 2020-02-19 (×2): 40 meq via ORAL
  Filled 2020-02-19 (×2): qty 2

## 2020-02-19 MED ORDER — SODIUM CHLORIDE 0.9 % IV SOLN
2.0000 g | INTRAVENOUS | Status: DC
  Administered 2020-02-19 – 2020-02-20 (×2): 2 g via INTRAVENOUS
  Filled 2020-02-19: qty 20
  Filled 2020-02-19: qty 2
  Filled 2020-02-19 (×2): qty 20

## 2020-02-19 NOTE — Progress Notes (Addendum)
[facility]  PROGRESS NOTE  Kristi Collins UTM:[id number] DOB: 1925-10-07 DOA: 02/16/2020 PCP: Patient, No Pcp Per   Brief HPI:   86 year old female with history of dementia, hypertension, hyperlipidemia who is currently resident of nursing facility.  Patient evidently wheelchair-bound as per the family.  As per family patient without fever, vomiting abdominal pain.  Fever was persistent.  Lactic acid was elevated 3.7, AST/ALT elevated at 401/251.  WBC count was normal.  CT of the abdomen pelvis indicated possible proctocolitis moderate stool burden.    Subjective   Patient seen and examined, denies any complaints.   Assessment/Plan:     1. Sepsis-patient presented with fever, tachycardia, lactic acidosis.  Sepsis physiology has resolved.  Abdominal CT shows possible colitis.  Also has UTI.  CT scan showed fluid-filled distended endometrial canal which is nonspecific.  CT maxillofacial did not show any dental abscess.  Continue empiric antibiotics ceftriaxone and Flagyl. 2. Endometrial canal fluid-CT abdomen pelvis shows fluid-filled endometrial canal which is nonspecific, pelvic ultrasound shows fluid in endometrial canal with possible mass in the uterine fundus, will discuss with GYN, may need outpatient GYN referral.   3. UTI-urine culture grew E. coli, sensitive to ceftriaxone.  Will change cefepime to ceftriaxone. 4. Colitis-seen on CT abdomen.  No previous history of diarrhea.  Started on Flagyl and ceftriaxone as above. 5. Transaminitis-patient presented with significant elevated AST and ALT of 401/251, it has currently improved to 109/133.  Alk phos is also down to 207, abdominal ultrasound shows  heterogeneous liver texture, hepatocellular disease versus hepatic steatosis.  Status post cholecystectomy, dilation of CBD common finding.  Follow LFTs in a.m.  Likely in the setting of sepsis. 6. Recent dental infection-patient's HC POA reported that patient was receiving oral  antibiotic for past 2 weeks, she is supposed to follow-up with oral surgeon on 02/23/2020.  CT maxillofacial was done which was negative for any abscess. 7. Hypokalemia-potassium 3.2, replace potassium and follow BMP in am. 8. Hypertension-patient takes ARB at home which is currently on hold in setting of sepsis. 9. Dementia-currently no bladder disturbance, continue Depakote. 10. Hyperlipidemia-statin on hold due to elevated LFTs. 11. Generalized weakness-patient is chronically wheelchair-bound.     COVID-19 Labs  No results for input(s): DDIMER, FERRITIN, LDH, CRP in the last 72 hours.  Lab Results  Component Value Date   SARSCOV2NAA NEGATIVE 02/16/2020   Pablo Pena NEGATIVE 01/28/2020     Scheduled medications:   . aspirin  81 mg Oral Daily  . divalproex  125 mg Oral BID  . docusate sodium  100 mg Oral Daily  . enoxaparin (LOVENOX) injection  40 mg Subcutaneous Daily  . potassium chloride  40 mEq Oral BID     SpO2: 98 %    CBC: Recent Labs  Lab 02/16/20 1957 02/16/20 2031 02/17/20 1821 02/18/20 0438 02/19/20 0127  WBC 9.6  --  9.2 6.5 6.3  NEUTROABS 8.5*  --   --   --   --   HGB 11.7* 11.6* 11.2* 10.7* 11.0*  HCT 36.1 34.0* 35.0* 32.1* 33.8*  MCV 85.5  --  86.2 84.3 85.6  PLT 131*  --  127* 118* 147*    Basic Metabolic Panel: Recent Labs  Lab 02/16/20 1957 02/16/20 2031 02/17/20 1821 02/18/20 0438 02/19/20 0127  NA 134* 135  --  137 137  K 3.9 3.9  --  3.4* 3.2*  CL 101  --   --  107 106  CO2 22  --   --  22 24  GLUCOSE 153*  --   --  109* 108*  BUN 18  --   --  10 6*  CREATININE 0.86  --  0.67 0.56 0.57  CALCIUM 8.8*  --   --  8.5* 8.5*     Liver Function Tests: Recent Labs  Lab 02/16/20 1957 02/18/20 0438 02/19/20 0127  AST 401* 109* 75*  ALT 251* 133* 112*  ALKPHOS 332* 207* 197*  BILITOT 2.0* 2.5* 1.2  PROT 5.5* 4.4* 4.5*  ALBUMIN 2.9* 2.2* 2.4*     Antibiotics: Anti-infectives (From admission, onward)   Start     Dose/Rate  Route Frequency Ordered Stop   02/19/20 2200  cefTRIAXone (ROCEPHIN) 2 g in sodium chloride 0.9 % 100 mL IVPB        2 g 200 mL/hr over 30 Minutes Intravenous Every 24 hours 02/19/20 1313     02/17/20 1300  metroNIDAZOLE (FLAGYL) IVPB 500 mg        500 mg 100 mL/hr over 60 Minutes Intravenous Every 8 hours 02/17/20 1202     02/17/20 1215  ceFEPIme (MAXIPIME) 2 g in sodium chloride 0.9 % 100 mL IVPB  Status:  Discontinued        2 g 200 mL/hr over 30 Minutes Intravenous  Once 02/17/20 1202 02/17/20 1206   02/17/20 0800  vancomycin (VANCOREADY) IVPB 750 mg/150 mL  Status:  Discontinued        750 mg 150 mL/hr over 60 Minutes Intravenous Every 12 hours 02/16/20 2016 02/17/20 1202   02/17/20 0800  ceFEPIme (MAXIPIME) 2 g in sodium chloride 0.9 % 100 mL IVPB  Status:  Discontinued        2 g 200 mL/hr over 30 Minutes Intravenous Every 12 hours 02/16/20 2016 02/19/20 1313   02/16/20 2015  vancomycin (VANCOREADY) IVPB 2000 mg/400 mL        2,000 mg 200 mL/hr over 120 Minutes Intravenous  Once 02/16/20 2004 02/17/20 0151   02/16/20 2000  ceFEPIme (MAXIPIME) 2 g in sodium chloride 0.9 % 100 mL IVPB        2 g 200 mL/hr over 30 Minutes Intravenous  Once 02/16/20 1957 02/16/20 2102   02/16/20 2000  metroNIDAZOLE (FLAGYL) IVPB 500 mg        500 mg 100 mL/hr over 60 Minutes Intravenous  Once 02/16/20 1957 02/16/20 2326   02/16/20 2000  vancomycin (VANCOCIN) IVPB 1000 mg/200 mL premix  Status:  Discontinued        1,000 mg 200 mL/hr over 60 Minutes Intravenous  Once 02/16/20 1957 02/16/20 2004       DVT prophylaxis: Lovenox  Code Status: DNR  Family Communication: No family at bedside   Consultants:    Procedures:      Objective   Vitals:   02/18/20 1513 02/18/20 2031 02/19/20 0401 02/19/20 1511  BP: (!) 145/74 (!) 143/77 (!) 142/75 (!) 146/86  Pulse: 66 65 68 72  Resp: 19 15 16 16   Temp: 97.7 F (36.5 C) 97.6 F (36.4 C) 97.8 F (36.6 C) (!) 97.4 F (36.3 C)  TempSrc:  Oral Oral Oral Oral  SpO2: 100% 98% 98% 98%  Weight:      Height:        Intake/Output Summary (Last 24 hours) at 02/19/2020 1527 Last data filed at 02/19/2020 1257 Gross per 24 hour  Intake 460 ml  Output 800 ml  Net -340 ml    10/28 1901 - 10/30 0700 In: 2054.4 [P.O.:220;  I.V.:1431.3] Out: 200 [Urine:200]  Filed Weights   02/16/20 1939  Weight: 90 kg    Physical Examination:    General-appears in no acute distress  Heart-S1-S2, regular, no murmur auscultated  Lungs-clear to auscultation bilaterally, no wheezing or crackles auscultated  Abdomen-soft, nontender, no organomegaly  Extremities-no edema in the lower extremities  Neuro-alert, pleasantly confused   Status is: Inpatient  Dispo: The patient is from: Skilled nursing facility              Anticipated d/c is to: Skilled nursing facility              Anticipated d/c date is: 02/21/2020              Patient currently not medically stable for discharge  Barrier to discharge-ongoing treatment for sepsis            Data Reviewed:   Recent Results (from the past 240 hour(s))  Urine Culture     Status: Abnormal   Collection Time: 02/16/20  7:56 PM   Specimen: Urine, Random  Result Value Ref Range Status   Specimen Description URINE, RANDOM  Final   Special Requests   Final    NONE Performed at [facility], [address] [place], [place] [address]    Culture >=100,000 COLONIES/mL ESCHERICHIA COLI (A)  Final   Report Status 02/19/2020 FINAL  Final   Organism ID, Bacteria ESCHERICHIA COLI (A)  Final      Susceptibility   Escherichia coli - MIC*    AMPICILLIN >=32 RESISTANT Resistant     CEFAZOLIN <=4 SENSITIVE Sensitive     CEFTRIAXONE <=0.25 SENSITIVE Sensitive     CIPROFLOXACIN <=0.25 SENSITIVE Sensitive     GENTAMICIN <=1 SENSITIVE Sensitive     IMIPENEM <=0.25 SENSITIVE Sensitive     NITROFURANTOIN <=16 SENSITIVE Sensitive     TRIMETH/SULFA <=20 SENSITIVE Sensitive      AMPICILLIN/SULBACTAM >=32 RESISTANT Resistant     PIP/TAZO <=4 SENSITIVE Sensitive     * >=100,000 COLONIES/mL ESCHERICHIA COLI  Blood Culture (routine x 2)     Status: None (Preliminary result)   Collection Time: 02/16/20  8:00 PM   Specimen: BLOOD  Result Value Ref Range Status   Specimen Description BLOOD SITE NOT SPECIFIED  Final   Special Requests   Final    BOTTLES DRAWN AEROBIC AND ANAEROBIC Blood Culture adequate volume   Culture   Final    NO GROWTH 3 DAYS Performed at [facility], [address] [place], [place] [address]    Report Status PENDING  Incomplete  Blood Culture (routine x 2)     Status: None (Preliminary result)   Collection Time: 02/16/20  8:10 PM   Specimen: BLOOD  Result Value Ref Range Status   Specimen Description BLOOD SITE NOT SPECIFIED  Final   Special Requests   Final    BOTTLES DRAWN AEROBIC AND ANAEROBIC Blood Culture adequate volume   Culture   Final    NO GROWTH 3 DAYS Performed at [facility], [address] [place], [place] [address]    Report Status PENDING  Incomplete  Respiratory Panel by RT PCR (Flu A&B, Covid) - Nasopharyngeal Swab     Status: None   Collection Time: 02/16/20  8:10 PM   Specimen: Nasopharyngeal Swab  Result Value Ref Range Status   SARS Coronavirus 2 by RT PCR NEGATIVE NEGATIVE Final    Comment: (NOTE) SARS-CoV-2 target nucleic acids are NOT  DETECTED.  The SARS-CoV-2 RNA is generally detectable in upper respiratoy specimens during the acute phase of infection. The lowest concentration of SARS-CoV-2 viral copies this assay can detect is 131 copies/mL. A negative result does not preclude SARS-Cov-2 infection and should not be used as the sole basis for treatment or other patient management decisions. A negative result may occur with  improper specimen collection/handling, submission of specimen other than nasopharyngeal swab, presence of viral mutation(s) within the areas targeted by this assay, and  inadequate number of viral copies (<131 copies/mL). A negative result must be combined with clinical observations, patient history, and epidemiological information. The expected result is Negative.  Fact Sheet for Patients:  PinkCheek.be  Fact Sheet for Healthcare Providers:  GravelBags.it  This test is no t yet approved or cleared by the [place] FDA and  has been authorized for detection and/or diagnosis of SARS-CoV-2 by FDA under an Emergency Use Authorization (EUA). This EUA will remain  in effect (meaning this test can be used) for the duration of the COVID-19 declaration under Section 564(b)(1) of the Act, 21 U.S.C. section 360bbb-3(b)(1), unless the authorization is terminated or revoked sooner.     Influenza A by PCR NEGATIVE NEGATIVE Final   Influenza B by PCR NEGATIVE NEGATIVE Final    Comment: (NOTE) The Xpert Xpress SARS-CoV-2/FLU/RSV assay is intended as an aid in  the diagnosis of influenza from Nasopharyngeal swab specimens and  should not be used as a sole basis for treatment. Nasal washings and  aspirates are unacceptable for Xpert Xpress SARS-CoV-2/FLU/RSV  testing.  Fact Sheet for Patients: PinkCheek.be  Fact Sheet for Healthcare Providers: GravelBags.it  This test is not yet approved or cleared by the [place] FDA and  has been authorized for detection and/or diagnosis of SARS-CoV-2 by  FDA under an Emergency Use Authorization (EUA). This EUA will remain  in effect (meaning this test can be used) for the duration of the  Covid-19 declaration under Section 564(b)(1) of the Act, 21  U.S.C. section 360bbb-3(b)(1), unless the authorization is  terminated or revoked. Performed at [facility], [place] [address], [place], [place] [address]   Blood culture (routine x 2)     Status: None (Preliminary result)   Collection Time:  02/17/20  3:18 AM   Specimen: BLOOD  Result Value Ref Range Status   Specimen Description BLOOD SITE NOT SPECIFIED  Final   Special Requests   Final    BOTTLES DRAWN AEROBIC AND ANAEROBIC Blood Culture results may not be optimal due to an inadequate volume of blood received in culture bottles   Culture   Final    NO GROWTH 2 DAYS Performed at [facility], [place] [address], [place], [place] [address]    Report Status PENDING  Incomplete    No results for input(s): LIPASE, AMYLASE in the last 168 hours. No results for input(s): AMMONIA in the last 168 hours.  Cardiac Enzymes: No results for input(s): CKTOTAL, CKMB, CKMBINDEX, TROPONINI in the last 168 hours. BNP (last 3 results) No results for input(s): BNP in the last 8760 hours.  ProBNP (last 3 results) No results for input(s): PROBNP in the last 8760 hours.  Studies:  US PELVIS (TRANSABDOMINAL ONLY)  Result Date: 02/18/2020 CLINICAL DATA:  Pelvic inflammatory disease. EXAM: TRANSABDOMINAL ULTRASOUND OF PELVIS TECHNIQUE: Transabdominal ultrasound examination of the pelvis was performed including evaluation of the uterus, ovaries, adnexal regions, and pelvic cul-de-sac. COMPARISON:  CT 1 day prior FINDINGS: Uterus Measurements:  10.1 x 4.9 x 6.6 cm = volume: 171 mL. The endometrial canal is distended with fluid. There is a questionable mass at the uterine fundus measuring 3 x 2.2 cm. Endometrium Not well evaluated Right ovary Not visualized Left ovary Not visualized Other findings: There is a trace amount of free fluid in the patient's pelvis. IMPRESSION: 1. Fluid distended endometrial canal with a questionable mass at the uterine fundus. Gynecologic evaluation is recommended. 2. Neither ovary was visualized. Electronically Signed   By: Constance Holster M.D.   On: 02/18/2020 17:49   CT MAXILLOFACIAL W CONTRAST  Result Date: 02/18/2020 CLINICAL DATA:  Facial pain EXAM: CT MAXILLOFACIAL WITH CONTRAST TECHNIQUE: Multidetector CT  imaging of the maxillofacial structures was performed with intravenous contrast. Multiplanar CT image reconstructions were also generated. CONTRAST:  80m OMNIPAQUE IOHEXOL 300 MG/ML  SOLN COMPARISON:  None. FINDINGS: Osseous: Postsurgical changes of remote left pterional craniotomy. No acute fracture. Orbits: The globes are intact. Normal appearance of the intra- and extraconal fat. Symmetric extraocular muscles. Sinuses: No fluid levels or advanced mucosal thickening. Soft tissues: Normal visualized extracranial soft tissues. 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 IMPRESSION: 1. No acute abnormality of the face. 2. 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 Electronically Signed   By: KUlyses JarredM.D.   On: 02/18/2020 02:50       [place]  [facility] If 7PM-7AM, please contact night-coverage at [email or web address], Office  [phone]  02/19/2020, 3:27 PM  LOS: 2 days

## 2020-02-20 LAB — BASIC METABOLIC PANEL
Anion gap: 9 (ref 5–15)
BUN: <5 mg/dL — ABNORMAL LOW (ref 8–23)
CO2: 25 mmol/L (ref 22–32)
Calcium: 8.8 mg/dL — ABNORMAL LOW (ref 8.9–10.3)
Chloride: 104 mmol/L (ref 98–111)
Creatinine, Ser: 0.53 mg/dL (ref 0.44–1.00)
GFR, Estimated: >60 mL/min (ref >60)
Glucose, Bld: 94 mg/dL (ref 70–99)
Potassium: 4.2 mmol/L (ref 3.5–5.1)
Sodium: 138 mmol/L (ref 135–145)

## 2020-02-20 NOTE — Progress Notes (Addendum)
[facility]  PROGRESS NOTE  Kristi Collins PXT:[id number] DOB: 30-Nov-1925 DOA: 02/16/2020 PCP: Patient, No Pcp Per   Brief HPI:   86 year old female with history of dementia, hypertension, hyperlipidemia who is currently resident of nursing facility.  Patient evidently wheelchair-bound as per the family.  As per family patient without fever, vomiting abdominal pain.  Fever was persistent.  Lactic acid was elevated 3.7, AST/ALT elevated at 401/251.  WBC count was normal.  CT of the abdomen pelvis indicated possible proctocolitis moderate stool burden.  Subjective   Patient seen and examined, she is more alert, pleasantly confused.  Denies any pain.   Assessment/Plan:     1. Sepsis-patient presented with fever, tachycardia, lactic acidosis.  Sepsis physiology has resolved.  Abdominal CT shows possible colitis.  Also has UTI.  CT scan showed fluid-filled distended endometrial canal which is nonspecific.  Pelvic ultrasound showed uterine mass.  CT maxillofacial did not show any dental abscess.  Continue empiric antibiotics ceftriaxone and Flagyl. 2. Endometrial canal fluid-CT abdomen pelvis shows fluid-filled endometrial canal which is nonspecific, pelvic ultrasound shows fluid in endometrial canal with possible mass in the uterine fundus, called and discussed with patient's niece who is the [facility] POA.  She wants patient to be seen by gynecology at [facility].  She says that she will discuss with physicians at [facility] and get an outpatient referral.     3. UTI-urine culture grew E. coli, sensitive to ceftriaxone.  Cefepime was changed to ceftriaxone.  We will switch to p.o. Vantin in a.m. 4. Colitis-seen on CT abdomen.  No previous history of diarrhea.  Started on Flagyl and ceftriaxone as above. 5. Transaminitis-patient presented with significant elevated AST and ALT of 401/251,  Likely in the setting of sepsis, it has currently improved to 75/112.  Alk phos is also down to 207, abdominal  ultrasound shows  heterogeneous liver texture, hepatocellular disease versus hepatic steatosis.  Status post cholecystectomy, dilation of CBD common finding.  Follow LFTs in a.m.  6. Recent dental infection-patient's HC POA reported that patient was receiving oral antibiotic for past 2 weeks, she is supposed to follow-up with oral surgeon on 02/23/2020.  CT maxillofacial was done which was negative for any abscess. 7. Hypokalemia-replete 8. Hypertension-patient takes ARB at home which is currently on hold in setting of sepsis. 9. Dementia-currently no bladder disturbance, continue Depakote. 10. Hyperlipidemia-statin on hold due to elevated LFTs. 11. Generalized weakness-patient is chronically wheelchair-bound.     COVID-19 Labs  No results for input(s): DDIMER, FERRITIN, LDH, CRP in the last 72 hours.  Lab Results  Component Value Date   SARSCOV2NAA NEGATIVE 02/16/2020   [place] NEGATIVE 01/28/2020     Scheduled medications:   . aspirin  81 mg Oral Daily  . divalproex  125 mg Oral BID  . docusate sodium  100 mg Oral Daily  . enoxaparin (LOVENOX) injection  40 mg Subcutaneous Daily     SpO2: 100 %    CBC: Recent Labs  Lab 02/16/20 1957 02/16/20 2031 02/17/20 1821 02/18/20 0438 02/19/20 0127  WBC 9.6  --  9.2 6.5 6.3  NEUTROABS 8.5*  --   --   --   --   HGB 11.7* 11.6* 11.2* 10.7* 11.0*  HCT 36.1 34.0* 35.0* 32.1* 33.8*  MCV 85.5  --  86.2 84.3 85.6  PLT 131*  --  127* 118* 147*    Basic Metabolic Panel: Recent Labs  Lab 02/16/20 1957 02/16/20 2031 02/17/20 1821 02/18/20 0438 02/19/20 0127 02/20/20  0351  NA 134* 135  --  137 137 138  K 3.9 3.9  --  3.4* 3.2* 4.2  CL 101  --   --  107 106 104  CO2 22  --   --  22 24 25   GLUCOSE 153*  --   --  109* 108* 94  BUN 18  --   --  10 6* <5*  CREATININE 0.86  --  0.67 0.56 0.57 0.53  CALCIUM 8.8*  --   --  8.5* 8.5* 8.8*     Liver Function Tests: Recent Labs  Lab 02/16/20 1957 02/18/20 0438  02/19/20 0127  AST 401* 109* 75*  ALT 251* 133* 112*  ALKPHOS 332* 207* 197*  BILITOT 2.0* 2.5* 1.2  PROT 5.5* 4.4* 4.5*  ALBUMIN 2.9* 2.2* 2.4*     Antibiotics: Anti-infectives (From admission, onward)   Start     Dose/Rate Route Frequency Ordered Stop   02/19/20 2200  cefTRIAXone (ROCEPHIN) 2 g in sodium chloride 0.9 % 100 mL IVPB        2 g 200 mL/hr over 30 Minutes Intravenous Every 24 hours 02/19/20 1313     02/17/20 1300  metroNIDAZOLE (FLAGYL) IVPB 500 mg        500 mg 100 mL/hr over 60 Minutes Intravenous Every 8 hours 02/17/20 1202     02/17/20 1215  ceFEPIme (MAXIPIME) 2 g in sodium chloride 0.9 % 100 mL IVPB  Status:  Discontinued        2 g 200 mL/hr over 30 Minutes Intravenous  Once 02/17/20 1202 02/17/20 1206   02/17/20 0800  vancomycin (VANCOREADY) IVPB 750 mg/150 mL  Status:  Discontinued        750 mg 150 mL/hr over 60 Minutes Intravenous Every 12 hours 02/16/20 2016 02/17/20 1202   02/17/20 0800  ceFEPIme (MAXIPIME) 2 g in sodium chloride 0.9 % 100 mL IVPB  Status:  Discontinued        2 g 200 mL/hr over 30 Minutes Intravenous Every 12 hours 02/16/20 2016 02/19/20 1313   02/16/20 2015  vancomycin (VANCOREADY) IVPB 2000 mg/400 mL        2,000 mg 200 mL/hr over 120 Minutes Intravenous  Once 02/16/20 2004 02/17/20 0151   02/16/20 2000  ceFEPIme (MAXIPIME) 2 g in sodium chloride 0.9 % 100 mL IVPB        2 g 200 mL/hr over 30 Minutes Intravenous  Once 02/16/20 1957 02/16/20 2102   02/16/20 2000  metroNIDAZOLE (FLAGYL) IVPB 500 mg        500 mg 100 mL/hr over 60 Minutes Intravenous  Once 02/16/20 1957 02/16/20 2326   02/16/20 2000  vancomycin (VANCOCIN) IVPB 1000 mg/200 mL premix  Status:  Discontinued        1,000 mg 200 mL/hr over 60 Minutes Intravenous  Once 02/16/20 1957 02/16/20 2004       DVT prophylaxis: Lovenox  Code Status: DNR  Family Communication: Discussed with patient's niece Blaine Hamper [place] on phone, [phone]    Objective    Vitals:   02/19/20 0401 02/19/20 1511 02/19/20 2149 02/20/20 0358  BP: (!) 142/75 (!) 146/86 140/77 (!) 142/74  Pulse: 68 72 76 72  Resp: 16 16 16 16   Temp: 97.8 F (36.6 C) (!) 97.4 F (36.3 C) 97.7 F (36.5 C) 98 F (36.7 C)  TempSrc: Oral Oral Oral Oral  SpO2: 98% 98% 99% 100%  Weight:      Height:  Intake/Output Summary (Last 24 hours) at 02/20/2020 1036 Last data filed at 02/20/2020 1638 Gross per 24 hour  Intake 1830 ml  Output 3400 ml  Net -1570 ml    10/29 1901 - 10/31 0700 In: 2110 [P.O.:460; I.V.:1350] Out: 93 [Urine:3600]  Filed Weights   02/16/20 1939  Weight: 90 kg    Physical Examination:    General-appears in no acute distress  Heart-S1-S2, regular, no murmur auscultated  Lungs-clear to auscultation bilaterally, no wheezing or crackles auscultated  Abdomen-soft, nontender, no organomegaly  Extremities-no edema in the lower extremities  Neuro-alert, oriented to self only, pleasantly confused   Status is: Inpatient  Dispo: The patient is from: Skilled nursing facility              Anticipated d/c is to: Skilled nursing facility              Anticipated d/c date is: 02/21/2020              Patient currently not medically stable for discharge  Barrier to discharge-ongoing treatment for sepsis      Data Reviewed:   Recent Results (from the past 240 hour(s))  Urine Culture     Status: Abnormal   Collection Time: 02/16/20  7:56 PM   Specimen: Urine, Random  Result Value Ref Range Status   Specimen Description URINE, RANDOM  Final   Special Requests   Final    NONE Performed at [facility], [address], [place], [place] [address]    Culture >=100,000 COLONIES/mL ESCHERICHIA COLI (A)  Final   Report Status 02/19/2020 FINAL  Final   Organism ID, Bacteria ESCHERICHIA COLI (A)  Final      Susceptibility   Escherichia coli - MIC*    AMPICILLIN >=32 RESISTANT Resistant     CEFAZOLIN <=4 SENSITIVE Sensitive      CEFTRIAXONE <=0.25 SENSITIVE Sensitive     CIPROFLOXACIN <=0.25 SENSITIVE Sensitive     GENTAMICIN <=1 SENSITIVE Sensitive     IMIPENEM <=0.25 SENSITIVE Sensitive     NITROFURANTOIN <=16 SENSITIVE Sensitive     TRIMETH/SULFA <=20 SENSITIVE Sensitive     AMPICILLIN/SULBACTAM >=32 RESISTANT Resistant     PIP/TAZO <=4 SENSITIVE Sensitive     * >=100,000 COLONIES/mL ESCHERICHIA COLI  Blood Culture (routine x 2)     Status: None (Preliminary result)   Collection Time: 02/16/20  8:00 PM   Specimen: BLOOD  Result Value Ref Range Status   Specimen Description BLOOD SITE NOT SPECIFIED  Final   Special Requests   Final    BOTTLES DRAWN AEROBIC AND ANAEROBIC Blood Culture adequate volume   Culture   Final    NO GROWTH 4 DAYS Performed at [facility], [address], [place], [place] [address]    Report Status PENDING  Incomplete  Blood Culture (routine x 2)     Status: None (Preliminary result)   Collection Time: 02/16/20  8:10 PM   Specimen: BLOOD  Result Value Ref Range Status   Specimen Description BLOOD SITE NOT SPECIFIED  Final   Special Requests   Final    BOTTLES DRAWN AEROBIC AND ANAEROBIC Blood Culture adequate volume   Culture   Final    NO GROWTH 4 DAYS Performed at [facility], [place] [address], [place], [place] [address]    Report Status PENDING  Incomplete  Respiratory Panel by RT PCR (Flu A&B, Covid) - Nasopharyngeal Swab     Status: None  Collection Time: 02/16/20  8:10 PM   Specimen: Nasopharyngeal Swab  Result Value Ref Range Status   SARS Coronavirus 2 by RT PCR NEGATIVE NEGATIVE Final    Comment: (NOTE) SARS-CoV-2 target nucleic acids are NOT DETECTED.  The SARS-CoV-2 RNA is generally detectable in upper respiratoy specimens during the acute phase of infection. The lowest concentration of SARS-CoV-2 viral copies this assay can detect is 131 copies/mL. A negative result does not preclude SARS-Cov-2 infection and should not be used as the sole basis  for treatment or other patient management decisions. A negative result may occur with  improper specimen collection/handling, submission of specimen other than nasopharyngeal swab, presence of viral mutation(s) within the areas targeted by this assay, and inadequate number of viral copies (<131 copies/mL). A negative result must be combined with clinical observations, patient history, and epidemiological information. The expected result is Negative.  Fact Sheet for Patients:  PinkCheek.be  Fact Sheet for Healthcare Providers:  GravelBags.it  This test is no t yet approved or cleared by the [place] FDA and  has been authorized for detection and/or diagnosis of SARS-CoV-2 by FDA under an Emergency Use Authorization (EUA). This EUA will remain  in effect (meaning this test can be used) for the duration of the COVID-19 declaration under Section 564(b)(1) of the Act, 21 U.S.C. section 360bbb-3(b)(1), unless the authorization is terminated or revoked sooner.     Influenza A by PCR NEGATIVE NEGATIVE Final   Influenza B by PCR NEGATIVE NEGATIVE Final    Comment: (NOTE) The Xpert Xpress SARS-CoV-2/FLU/RSV assay is intended as an aid in  the diagnosis of influenza from Nasopharyngeal swab specimens and  should not be used as a sole basis for treatment. Nasal washings and  aspirates are unacceptable for Xpert Xpress SARS-CoV-2/FLU/RSV  testing.  Fact Sheet for Patients: PinkCheek.be  Fact Sheet for Healthcare Providers: GravelBags.it  This test is not yet approved or cleared by the [place] FDA and  has been authorized for detection and/or diagnosis of SARS-CoV-2 by  FDA under an Emergency Use Authorization (EUA). This EUA will remain  in effect (meaning this test can be used) for the duration of the  Covid-19 declaration under Section 564(b)(1) of the Act, 21   U.S.C. section 360bbb-3(b)(1), unless the authorization is  terminated or revoked. Performed at [facility], [place] [address] [place], [place] [address]   Blood culture (routine x 2)     Status: None (Preliminary result)   Collection Time: 02/17/20  3:18 AM   Specimen: BLOOD  Result Value Ref Range Status   Specimen Description BLOOD SITE NOT SPECIFIED  Final   Special Requests   Final    BOTTLES DRAWN AEROBIC AND ANAEROBIC Blood Culture results may not be optimal due to an inadequate volume of blood received in culture bottles   Culture   Final    NO GROWTH 3 DAYS Performed at [facility], [place] [address] [place], [place] [address]    Report Status PENDING  Incomplete     Studies:  US PELVIS (TRANSABDOMINAL ONLY)  Result Date: 02/18/2020 CLINICAL DATA:  Pelvic inflammatory disease. EXAM: TRANSABDOMINAL ULTRASOUND OF PELVIS TECHNIQUE: Transabdominal ultrasound examination of the pelvis was performed including evaluation of the uterus, ovaries, adnexal regions, and pelvic cul-de-sac. COMPARISON:  CT 1 day prior FINDINGS: Uterus Measurements: 10.1 x 4.9 x 6.6 cm = volume: 171 mL. The endometrial canal is distended with fluid. There is a questionable mass at the uterine fundus measuring  3 x 2.2 cm. Endometrium Not well evaluated Right ovary Not visualized Left ovary Not visualized Other findings: There is a trace amount of free fluid in the patient's pelvis. IMPRESSION: 1. Fluid distended endometrial canal with a questionable mass at the uterine fundus. Gynecologic evaluation is recommended. 2. Neither ovary was visualized. Electronically Signed   By: Constance Holster M.D.   On: 02/18/2020 17:49       [place]   [facility] If 7PM-7AM, please contact night-coverage at [email or web address], Office  [phone]   02/20/2020, 10:36 AM  LOS: 3 days

## 2020-02-21 LAB — CULTURE, BLOOD (ROUTINE X 2)
Culture: NO GROWTH
Culture: NO GROWTH
Special Requests: ADEQUATE
Special Requests: ADEQUATE

## 2020-02-21 LAB — SARS CORONAVIRUS 2 BY RT PCR (HOSPITAL ORDER, PERFORMED IN ~~LOC~~ HOSPITAL LAB): SARS Coronavirus 2: NEGATIVE

## 2020-02-21 MED ORDER — LABETALOL HCL 5 MG/ML IV SOLN: 10.0000 mg | Freq: Once | INTRAVENOUS | Status: DC

## 2020-02-21 MED ORDER — LABETALOL HCL 5 MG/ML IV SOLN: 5.0000 mg | Freq: Once | INTRAVENOUS | Status: DC

## 2020-02-21 MED ORDER — LABETALOL HCL 100 MG PO TABS
100.0000 mg | ORAL_TABLET | Freq: Once | ORAL | Status: AC
  Administered 2020-02-21: 100 mg via ORAL
  Filled 2020-02-21: qty 1

## 2020-02-21 NOTE — Progress Notes (Addendum)
Patient being discharged to [facility] skilled nursing facility. Report given to [employer], Charity fundraiser at [facility]. Pt Alert, oriented to self only. On room air. On call MD called for PRN orders for elevated BP. BP treated with PO labetalol prior to discharge.

## 2020-02-21 NOTE — TOC Transition Note (Signed)
Transition of Care [facility]) - CM/SW Discharge Note   Patient Details  Name: Kristi Collins MRN: [id number] Date of Birth: 12-12-1925  [facility] CM/SW Contact:  Erin Sons, LCSW Phone Number: 02/21/2020, 1:08 PM   Clinical Narrative:     Patient will DC to: [facility] Anticipated DC date: 02/21/20 Family notified:   Marylene Land (Niece)  [phone] [facility] Phone)    Transport by: Sharin Mons   Per MD patient ready for DC to [facility]. RN, patient, patient's family, and facility notified of DC. Discharge Summary and FL2 sent to facility. RN to call report prior to discharge (Room 146 [phone] Nurse is Kyla Balzarine ). DC packet on chart. Ambulance transport requested for patient.   CSW will sign off for now as social work intervention is no longer needed. Please consult [place] again if new needs arise.   Final next level of care: Skilled Nursing Facility Barriers to Discharge: No Barriers Identified   Patient Goals and CMS Choice        Discharge Placement                Patient to be transferred to facility by: [facility] Name of family member notified: Marylene Land (Niece) [phone] [facility] Phone) Patient and family notified of of transfer: 02/21/20  Discharge Plan and Services                                     Social Determinants of Health (SDOH) Interventions     Readmission Risk Interventions No flowsheet data found.

## 2020-02-21 NOTE — Plan of Care (Signed)
  Problem: Education: Goal: Knowledge of General Education information will improve Description: Including pain rating scale, medication(s)/side effects and non-pharmacologic comfort measures Outcome: Adequate for Discharge   Problem: Health Behavior/Discharge Planning: Goal: Ability to manage health-related needs will improve Outcome: Adequate for Discharge   Problem: Clinical Measurements: Goal: Diagnostic test results will improve Outcome: Adequate for Discharge   Problem: Pain Managment: Goal: General experience of comfort will improve Outcome: Adequate for Discharge   Problem: Safety: Goal: Ability to remain free from injury will improve Outcome: Adequate for Discharge   Problem: Skin Integrity: Goal: Risk for impaired skin integrity will decrease Outcome: Adequate for Discharge    Discharge to skilled nursing

## 2020-02-21 NOTE — Discharge Summary (Addendum)
Physician Discharge Summary  Kristi Collins PPJ:[id number] DOB: 11/09/1925 DOA: 02/16/2020  PCP: Patient, No Pcp Per  Admit date: 02/16/2020 Discharge date: 02/21/2020  Time spent: 50 minutes  Recommendations for Outpatient Follow-up:  1. Simvastatin has been stopped with elevated LFTs 2. Outpatient GYN evaluation for endometrial canal fluid and mass in the uterine fundus   Discharge Diagnoses:  Active Problems: Sepsis due to UTI   Colitis   Dementia (HCC)   HTN (hypertension)   Hyperlipidemia   Elevated LFTs   Sepsis ([place])   Discharge Condition: Stable  Diet recommendation: Heart healthy diet  Filed Weights   02/16/20 1939  Weight: 90 kg    History of present illness:  86 year old female with history of dementia, hypertension, hyperlipidemia who is currently resident of nursing facility.  Patient evidently wheelchair-bound as per the family.  As per family patient without fever, vomiting abdominal pain.  Fever was persistent.  Lactic acid was elevated 3.7, AST/ALT elevated at 401/251.  WBC count was normal.  CT of the abdomen pelvis indicated possible proctocolitis moderate stool burden.  Hospital Course:  1. Sepsis-patient presented with fever, tachycardia, lactic acidosis.  Sepsis physiology has resolved.  Abdominal CT shows possible colitis.  Also has UTI.  CT scan showed fluid-filled distended endometrial canal which is nonspecific.  Pelvic ultrasound showed uterine mass.  CT maxillofacial did not show any dental abscess.  Patient completed 5 days of antibiotic treatment in the hospital. 2. Endometrial canal fluid-CT abdomen pelvis shows fluid-filled endometrial canal which is nonspecific, pelvic ultrasound shows fluid in endometrial canal with possible mass in the uterine fundus, called and discussed with patient's niece who is the [facility] POA.  She wants patient to be seen by gynecology at [facility].  She says that she will discuss with physicians at [facility] and get an  outpatient referral.     3. UTI-urine culture grew E. coli, sensitive to ceftriaxone.  Cefepime was changed to ceftriaxone.  Patient has completed 5 days of antibiotic treatment in the hospital.  Will not give any more antibiotics. 4. Colitis-seen on CT abdomen.  No previous history of diarrhea.  Started on Flagyl and ceftriaxone as above.  Patient completed 5 days of treatment.  Will not give any more antibiotics. 5. Transaminitis-patient presented with significant elevated AST and ALT of 401/251,  Likely in the setting of sepsis, it has currently improved to 75/112.  Alk phos is also down to 207, abdominal ultrasound shows  heterogeneous liver texture, hepatocellular disease versus hepatic steatosis.  Status post cholecystectomy, dilation of CBD common finding.    LFTs have improved, will discontinue simvastatin at this time. 6. Recent dental infection-patient's HC POA reported that patient was receiving oral antibiotic for past 2 weeks, she is supposed to follow-up with oral surgeon on 02/23/2020.  CT maxillofacial was done which was negative for any abscess. 7. Hypokalemia-replete 8. Hypertension-patient takes ARB at home.  Continue taking losartan. 9. Dementia-currently no behavior disturbance, continue Depakote. 10. Hyperlipidemia-statin on hold due to elevated LFTs. 11. Generalized weakness-patient is chronically wheelchair-bound.    Procedures:  Consultations:    Discharge Exam: Vitals:   02/21/20 0412 02/21/20 0413  BP: (!) 150/79   Pulse: 61   Resp: 20   Temp:  (!) 97.2 F (36.2 C)  SpO2: 98%     General: Appears in no acute distress Cardiovascular: S1-S2, regular, no murmur auscultated Respiratory: Clear to auscultation bilaterally  Discharge Instructions   Discharge Instructions    Diet - low sodium  heart healthy   Complete by: As directed    Increase activity slowly   Complete by: As directed      Allergies as of 02/21/2020      Reactions   Latex    Per MAR       Medication List    STOP taking these medications   simvastatin 20 MG tablet Commonly known as: ZOCOR     TAKE these medications   acetaminophen 500 MG tablet Commonly known as: TYLENOL Take 500 mg by mouth every 6 (six) hours as needed for mild pain or fever.   aspirin 81 MG chewable tablet Chew 81 mg by mouth daily.   divalproex 125 MG capsule Commonly known as: DEPAKOTE SPRINKLE Take 125 mg by mouth 2 (two) times daily.   docusate sodium 100 MG capsule Commonly known as: COLACE Take 100 mg by mouth daily.   hydroxypropyl methylcellulose / hypromellose 2.5 % ophthalmic solution Commonly known as: ISOPTO TEARS / GONIOVISC Place 1 drop into both eyes every 4 (four) hours as needed for dry eyes.   losartan 50 MG tablet Commonly known as: COZAAR Take 50 mg by mouth daily.   senna 8.6 MG Tabs tablet Commonly known as: SENOKOT Take 8.6 mg by mouth daily as needed for mild constipation.   simethicone 80 MG chewable tablet Commonly known as: MYLICON Chew 80 mg by mouth 3 (three) times daily as needed for flatulence.   traMADol 50 MG tablet Commonly known as: ULTRAM Take 50 mg by mouth See admin instructions. 0800, 1400, 2000   vitamin B-12 1000 MCG tablet Commonly known as: CYANOCOBALAMIN Take 1,000 mcg by mouth daily.      Allergies  Allergen Reactions  . Latex     Per MAR      The results of significant diagnostics from this hospitalization (including imaging, microbiology, ancillary and laboratory) are listed below for reference.    Significant Diagnostic Studies: US PELVIS (TRANSABDOMINAL ONLY)  Result Date: 02/18/2020 CLINICAL DATA:  Pelvic inflammatory disease. EXAM: TRANSABDOMINAL ULTRASOUND OF PELVIS TECHNIQUE: Transabdominal ultrasound examination of the pelvis was performed including evaluation of the uterus, ovaries, adnexal regions, and pelvic cul-de-sac. COMPARISON:  CT 1 day prior FINDINGS: Uterus Measurements: 10.1 x 4.9 x 6.6 cm =  volume: 171 mL. The endometrial canal is distended with fluid. There is a questionable mass at the uterine fundus measuring 3 x 2.2 cm. Endometrium Not well evaluated Right ovary Not visualized Left ovary Not visualized Other findings: There is a trace amount of free fluid in the patient's pelvis. IMPRESSION: 1. Fluid distended endometrial canal with a questionable mass at the uterine fundus. Gynecologic evaluation is recommended. 2. Neither ovary was visualized. Electronically Signed   By: Constance Holster M.D.   On: 02/18/2020 17:49   CT ABDOMEN PELVIS W CONTRAST  Result Date: 02/17/2020 CLINICAL DATA:  Abdominal pain EXAM: CT ABDOMEN AND PELVIS WITH CONTRAST TECHNIQUE: Multidetector CT imaging of the abdomen and pelvis was performed using the standard protocol following bolus administration of intravenous contrast. CONTRAST:  154m OMNIPAQUE IOHEXOL 300 MG/ML  SOLN COMPARISON:  Sep 03, 2019 FINDINGS: Lower chest: The visualized heart size within normal limits. No pericardial fluid/thickening. Patchy airspace opacity seen at the posterior left lung base. Hepatobiliary: Heterogeneous low-density seen throughout the liver parenchyma.The main portal vein is patent. The patient is status post cholecystectomy. No biliary ductal dilation. Pancreas: Unremarkable. No pancreatic ductal dilatation or surrounding inflammatory changes. Spleen: Normal in size without focal abnormality. Adrenals/Urinary Tract: Both adrenal glands  appear normal. The kidneys and collecting system appear normal without evidence of urinary tract calculus or hydronephrosis. Bladder is unremarkable. Stomach/Bowel: There is a small hiatal hernia present. The small bowel is unremarkable. There is scattered colonic diverticula throughout. There is a moderate amount of colonic stool seen within the rectum with apparent diffuse mild wall thickening and presacral fat stranding changes. Vascular/Lymphatic: There are no enlarged mesenteric,  retroperitoneal, or pelvic lymph nodes. Scattered aortic atherosclerotic calcifications are seen without aneurysmal dilatation. Reproductive: There is fluid seen within the endometrial canal with a heterogeneous partially calcified uterine fibroid seen posteriorly. Other: No evidence of abdominal wall mass or hernia. Musculoskeletal: No acute or significant osseous findings. A healed left inferior pubic rami fracture is seen. A S-shaped scoliotic curvature of the thoracolumbar spine is noted. IMPRESSION: Mildly dilated stool-filled rectum with findings that could be suggestive of proctocolitis. Fluid-filled distended endometrial canal which is nonspecific, new since the prior exam of Sep 03, 2019. If further evaluation is required would recommend pelvic ultrasound. Small hiatal hernia Aortic Atherosclerosis (ICD10-I70.0). Electronically Signed   By: Prudencio Pair M.D.   On: 02/17/2020 01:06   CT MAXILLOFACIAL W CONTRAST  Result Date: 02/18/2020 CLINICAL DATA:  Facial pain EXAM: CT MAXILLOFACIAL WITH CONTRAST TECHNIQUE: Multidetector CT imaging of the maxillofacial structures was performed with intravenous contrast. Multiplanar CT image reconstructions were also generated. CONTRAST:  9m OMNIPAQUE IOHEXOL 300 MG/ML  SOLN COMPARISON:  None. FINDINGS: Osseous: Postsurgical changes of remote left pterional craniotomy. No acute fracture. Orbits: The globes are intact. Normal appearance of the intra- and extraconal fat. Symmetric extraocular muscles. Sinuses: No fluid levels or advanced mucosal thickening. Soft tissues: Normal visualized extracranial soft tissues. 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 IMPRESSION: 1. No acute abnormality of the face. 2. 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 Electronically Signed   By: KUlyses JarredM.D.   On: 02/18/2020 02:50   CT Maxillofacial W Contrast  Result Date:  01/28/2020 CLINICAL DATA:  Left lower jaw dental abscess EXAM: CT MAXILLOFACIAL WITH CONTRAST TECHNIQUE: Multidetector CT imaging of the maxillofacial structures was performed with intravenous contrast. Multiplanar CT image reconstructions were also generated. CONTRAST:  719mOMNIPAQUE IOHEXOL 300 MG/ML  SOLN COMPARISON:  None. FINDINGS: Motion artifact is present including through the mandible. Streak artifact from dental amalgam. Osseous: Tooth decay is present. In the left mandibular region, there is notable involvement of the posterior left premolar. Temporomandibular joints are unremarkable. Multilevel degenerative changes of the cervical spine. Orbits: No significant abnormality. Sinuses: No significant opacification. Soft tissues: There is left facial soft tissue swelling particularly along the body of the mandible extending to the buccal surface. There is no soft tissue abscess identified. 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 No abnormal intracranial enhancement identified. IMPRESSION: Suboptimal evaluation due to motion and streak artifact. Soft tissue swelling particularly along the body of the mandible on the left. Multifocal tooth decay including involvement of the posterior left mandibular premolar. No definite soft tissue abscess. Electronically Signed   By: PrMacy Mis.D.   On: 01/28/2020 14:45   DG Chest Port 1 View  Result Date: 02/16/2020 CLINICAL DATA:  Possible sepsis EXAM: PORTABLE CHEST 1 VIEW COMPARISON:  2013 FINDINGS: Rotated. Distorted thorax related to marked scoliosis. No definite new consolidation or edema. Chronic interstitial changes remain similar. No significant pleural effusion. No pneumothorax. Similar cardiomediastinal contours. IMPRESSION:  No definite acute process in the chest. Chronic interstitial changes. Electronically Signed   By: Macy Mis M.D.   On:  02/16/2020 20:17   US Abdomen Limited RUQ (LIVER/GB)  Result Date: 02/17/2020 CLINICAL DATA:  Elevated LFTs.  Prior cholecystectomy. EXAM: ULTRASOUND ABDOMEN LIMITED RIGHT UPPER QUADRANT COMPARISON:  CT abdomen 02/17/2020 FINDINGS: Gallbladder: Surgically absent Common bile duct: Diameter: Mildly dilated 7 mm. Liver: No focal lesion identified. No intrahepatic duct dilatation. Heterogeneous liver echotexture. Portal vein is patent on color Doppler imaging with normal direction of blood flow towards the liver. Other: No ascites IMPRESSION: 1. Heterogeneous liver echotexture. Differential would include hepatocellular disease versus hepatic steatosis. 2. Postcholecystectomy dilatation of the common bile duct is common finding. Electronically Signed   By: Suzy Bouchard M.D.   On: 02/17/2020 12:58    Microbiology: Recent Results (from the past 240 hour(s))  Urine Culture     Status: Abnormal   Collection Time: 02/16/20  7:56 PM   Specimen: Urine, Random  Result Value Ref Range Status   Specimen Description URINE, RANDOM  Final   Special Requests   Final    NONE Performed at [facility], [address] [place], [place] [address]    Culture >=100,000 COLONIES/mL ESCHERICHIA COLI (A)  Final   Report Status 02/19/2020 FINAL  Final   Organism ID, Bacteria ESCHERICHIA COLI (A)  Final      Susceptibility   Escherichia coli - MIC*    AMPICILLIN >=32 RESISTANT Resistant     CEFAZOLIN <=4 SENSITIVE Sensitive     CEFTRIAXONE <=0.25 SENSITIVE Sensitive     CIPROFLOXACIN <=0.25 SENSITIVE Sensitive     GENTAMICIN <=1 SENSITIVE Sensitive     IMIPENEM <=0.25 SENSITIVE Sensitive     NITROFURANTOIN <=16 SENSITIVE Sensitive     TRIMETH/SULFA <=20 SENSITIVE Sensitive     AMPICILLIN/SULBACTAM >=32 RESISTANT Resistant     PIP/TAZO <=4 SENSITIVE Sensitive     * >=100,000 COLONIES/mL ESCHERICHIA COLI  Blood Culture (routine x 2)     Status: None   Collection Time: 02/16/20  8:00 PM   Specimen:  BLOOD  Result Value Ref Range Status   Specimen Description BLOOD SITE NOT SPECIFIED  Final   Special Requests   Final    BOTTLES DRAWN AEROBIC AND ANAEROBIC Blood Culture adequate volume   Culture   Final    NO GROWTH 5 DAYS Performed at [facility], [place] [address] [place], [place] [address]    Report Status 02/21/2020 FINAL  Final  Blood Culture (routine x 2)     Status: None   Collection Time: 02/16/20  8:10 PM   Specimen: BLOOD  Result Value Ref Range Status   Specimen Description BLOOD SITE NOT SPECIFIED  Final   Special Requests   Final    BOTTLES DRAWN AEROBIC AND ANAEROBIC Blood Culture adequate volume   Culture   Final    NO GROWTH 5 DAYS Performed at [facility], [place] [address], [place], [place] [address]    Report Status 02/21/2020 FINAL  Final  Respiratory Panel by RT PCR (Flu A&B, Covid) - Nasopharyngeal Swab     Status: None   Collection Time: 02/16/20  8:10 PM   Specimen: Nasopharyngeal Swab  Result Value Ref Range Status   SARS Coronavirus 2 by RT PCR NEGATIVE NEGATIVE Final    Comment: (NOTE) SARS-CoV-2 target nucleic acids are NOT DETECTED.  The SARS-CoV-2 RNA is generally detectable in upper respiratoy specimens during the  acute phase of infection. The lowest concentration of SARS-CoV-2 viral copies this assay can detect is 131 copies/mL. A negative result does not preclude SARS-Cov-2 infection and should not be used as the sole basis for treatment or other patient management decisions. A negative result may occur with  improper specimen collection/handling, submission of specimen other than nasopharyngeal swab, presence of viral mutation(s) within the areas targeted by this assay, and inadequate number of viral copies (<131 copies/mL). A negative result must be combined with clinical observations, patient history, and epidemiological information. The expected result is Negative.  Fact Sheet for Patients:   PinkCheek.be  Fact Sheet for Healthcare Providers:  GravelBags.it  This test is no t yet approved or cleared by the [place] FDA and  has been authorized for detection and/or diagnosis of SARS-CoV-2 by FDA under an Emergency Use Authorization (EUA). This EUA will remain  in effect (meaning this test can be used) for the duration of the COVID-19 declaration under Section 564(b)(1) of the Act, 21 U.S.C. section 360bbb-3(b)(1), unless the authorization is terminated or revoked sooner.     Influenza A by PCR NEGATIVE NEGATIVE Final   Influenza B by PCR NEGATIVE NEGATIVE Final    Comment: (NOTE) The Xpert Xpress SARS-CoV-2/FLU/RSV assay is intended as an aid in  the diagnosis of influenza from Nasopharyngeal swab specimens and  should not be used as a sole basis for treatment. Nasal washings and  aspirates are unacceptable for Xpert Xpress SARS-CoV-2/FLU/RSV  testing.  Fact Sheet for Patients: PinkCheek.be  Fact Sheet for Healthcare Providers: GravelBags.it  This test is not yet approved or cleared by the [place] FDA and  has been authorized for detection and/or diagnosis of SARS-CoV-2 by  FDA under an Emergency Use Authorization (EUA). This EUA will remain  in effect (meaning this test can be used) for the duration of the  Covid-19 declaration under Section 564(b)(1) of the Act, 21  U.S.C. section 360bbb-3(b)(1), unless the authorization is  terminated or revoked. Performed at [facility], [place] [address] [place], [place] [address]   Blood culture (routine x 2)     Status: None (Preliminary result)   Collection Time: 02/17/20  3:18 AM   Specimen: BLOOD  Result Value Ref Range Status   Specimen Description BLOOD SITE NOT SPECIFIED  Final   Special Requests   Final    BOTTLES DRAWN AEROBIC AND ANAEROBIC Blood Culture results may not be optimal  due to an inadequate volume of blood received in culture bottles   Culture   Final    NO GROWTH 4 DAYS Performed at [facility], [place] [address], [place], [place] [address]    Report Status PENDING  Incomplete  SARS Coronavirus 2 by RT PCR (hospital order, performed in [facility] hospital lab) Nasopharyngeal Nasopharyngeal Swab     Status: None   Collection Time: 02/21/20 12:33 AM   Specimen: Nasopharyngeal Swab  Result Value Ref Range Status   SARS Coronavirus 2 NEGATIVE NEGATIVE Final    Comment: (NOTE) SARS-CoV-2 target nucleic acids are NOT DETECTED.  The SARS-CoV-2 RNA is generally detectable in upper and lower respiratory specimens during the acute phase of infection. The lowest concentration of SARS-CoV-2 viral copies this assay can detect is 250 copies / mL. A negative result does not preclude SARS-CoV-2 infection and should not be used as the sole basis for treatment or other patient management decisions.  A negative result may occur with improper specimen collection / handling, submission  of specimen other than nasopharyngeal swab, presence of viral mutation(s) within the areas targeted by this assay, and inadequate number of viral copies (<250 copies / mL). A negative result must be combined with clinical observations, patient history, and epidemiological information.  Fact Sheet for Patients:   StrictlyIdeas.no  Fact Sheet for Healthcare Providers: BankingDealers.co.za  This test is not yet approved or  cleared by the [place] FDA and has been authorized for detection and/or diagnosis of SARS-CoV-2 by FDA under an Emergency Use Authorization (EUA).  This EUA will remain in effect (meaning this test can be used) for the duration of the COVID-19 declaration under Section 564(b)(1) of the Act, 21 U.S.C. section 360bbb-3(b)(1), unless the authorization is terminated or revoked sooner.  Performed at [facility], [place] [address], [place], [place] [address]      Labs: Basic Metabolic Panel: Recent Labs  Lab 02/16/20 1957 02/16/20 2031 02/17/20 1821 02/18/20 0438 02/19/20 0127 02/20/20 0351  NA 134* 135  --  137 137 138  K 3.9 3.9  --  3.4* 3.2* 4.2  CL 101  --   --  107 106 104  CO2 22  --   --  _0 GLUCOSE 153*  --   --  109* 108* 94  BUN 18  --   --  10 6* <5*  CREATININE 0.86  --  0.67 0.56 0.57 0.53  CALCIUM 8.8*  --   --  8.5* 8.5* 8.8*   Liver Function Tests: Recent Labs  Lab 02/16/20 1957 02/18/20 0438 02/19/20 0127  AST 401* 109* 75*  ALT 251* 133* 112*  ALKPHOS 332* 207* 197*  BILITOT 2.0* 2.5* 1.2  PROT 5.5* 4.4* 4.5*  ALBUMIN 2.9* 2.2* 2.4*   No results for input(s): LIPASE, AMYLASE in the last 168 hours. No results for input(s): AMMONIA in the last 168 hours. CBC: Recent Labs  Lab 02/16/20 1957 02/16/20 2031 02/17/20 1821 02/18/20 0438 02/19/20 0127  WBC 9.6  --  9.2 6.5 6.3  NEUTROABS 8.5*  --   --   --   --   HGB 11.7* 11.6* 11.2* 10.7* 11.0*  HCT 36.1 34.0* 35.0* 32.1* 33.8*  MCV 85.5  --  86.2 84.3 85.6  PLT 131*  --  127* 118* 147*       Signed:  Oswald Hillock MD.  [facility] 02/21/2020, 12:12 PM

## 2020-02-21 NOTE — Care Management Important Message (Signed)
Important Message  Patient Details  Name: Kristi Collins MRN: [id number] Date of Birth: 05/05/1925   Medicare Important Message Given:  Yes     Valiant Dills Stefan Church 02/21/2020, 4:16 PM

## 2020-02-21 NOTE — [place] FL2 (Signed)
[place] MEDICAID FL2 LEVEL OF CARE SCREENING TOOL     IDENTIFICATION  Patient Name: Kristi Collins Birthdate: 10-Jun-1925 Sex: female Admission Date (Current Location): 02/16/2020  [facility] and [place] Number:  Producer, television/film/video and Address:  [facility] [place], [address] [place], [place] [address]      Provider Number: [id number]  Attending Physician Name and Address:  Meredeth Ide, MD  Relative Name and Phone Number:  Marylene Land (Niece) [phone] [facility] Phone)    Current Level of Care: SNF Recommended Level of Care: Skilled Nursing Facility Prior Approval Number: [id number]  Date Approved/Denied:   PASRR Number: [id number]  Discharge Plan: SNF    Current Diagnoses: Patient Active Problem List   Diagnosis Date Noted   Colitis 02/17/2020   Dementia (HCC) 02/17/2020   HTN (hypertension) 02/17/2020   Hyperlipidemia 02/17/2020   Elevated LFTs 02/17/2020   Sepsis (HCC) 02/17/2020   Tooth infection 01/28/2020    Orientation RESPIRATION BLADDER Height & Weight     Self  Normal External catheter Weight: 198 lb 6.6 oz (90 kg) Height:  5\' 5"  (165.1 cm)  BEHAVIORAL SYMPTOMS/MOOD NEUROLOGICAL BOWEL NUTRITION STATUS      Continent Diet (see d/c summary)  AMBULATORY STATUS COMMUNICATION OF NEEDS Skin   Extensive Assist Verbally Normal                       Personal Care Assistance Level of Assistance  Bathing, Feeding, Dressing Bathing Assistance: Limited assistance Feeding assistance: Independent Dressing Assistance: Limited assistance     Functional Limitations Info  Speech, Hearing, Sight Sight Info: Adequate Hearing Info: Adequate Speech Info: Adequate    SPECIAL CARE FACTORS FREQUENCY  PT (By licensed PT), OT (By licensed OT)     PT Frequency: 0 OT Frequency: 0            Contractures Contractures Info: Not present    Additional Factors Info  Code Status, Allergies Code Status Info: DNR Allergies  Info: Latex           Current Medications (02/21/2020):  This is the current hospital active medication list Current Facility-Administered Medications  Medication Dose Route Frequency Provider Last Rate Last Admin   acetaminophen (TYLENOL) tablet 650 mg  650 mg Oral Q6H PRN 13/04/2019, MD   650 mg at 02/20/20 2020   Or   acetaminophen (TYLENOL) suppository 650 mg  650 mg Rectal Q6H PRN 2021, MD       aspirin chewable tablet 81 mg  81 mg Oral Daily Memon, Erick Blinks, MD   81 mg at 02/21/20 1024   cefTRIAXone (ROCEPHIN) 2 g in sodium chloride 0.9 % 100 mL IVPB  2 g Intravenous Q24H 13/01/21, MD 200 mL/hr at 02/20/20 2129 2 g at 02/20/20 2129   divalproex (DEPAKOTE SPRINKLE) capsule 125 mg  125 mg Oral BID 2130, MD   125 mg at 02/21/20 1025   docusate sodium (COLACE) capsule 100 mg  100 mg Oral Daily 13/01/21, MD   100 mg at 02/21/20 1024   enoxaparin (LOVENOX) injection 40 mg  40 mg Subcutaneous Daily 13/01/21, MD   40 mg at 02/21/20 1025   lactated ringers infusion   Intravenous Continuous 13/01/21, MD 100 mL/hr at 02/21/20 0658 Rate Verify at 02/21/20 0658   metroNIDAZOLE (FLAGYL) IVPB 500 mg  500 mg Intravenous Q8H 13/01/21, MD 100 mL/hr at 02/21/20 0407 500 mg at 02/21/20 0407  ondansetron (ZOFRAN) tablet 4 mg  4 mg Oral Q6H PRN Erick Blinks, MD       Or   ondansetron (ZOFRAN) injection 4 mg  4 mg Intravenous Q6H PRN Erick Blinks, MD         Discharge Medications: Please see discharge summary for a list of discharge medications.  Relevant Imaging Results:  Relevant Lab Results:   Additional Information SSN [id number] [place], [place]

## 2020-02-21 NOTE — Evaluation (Signed)
Physical Therapy Evaluation Patient Details Name: Kristi Collins MRN: [id number] DOB: 03-03-1926 Today's Date: 02/21/2020   History of Present Illness  Patient is a 86 y/o female who presents with sepsis and UTI. Abdominal CT- colitis. CT abdomen/pelvis shows fluid-filled endometrial canal. PMH includes dementia, HTN, osteoporesis and thyroid disease.  Clinical Impression  Patient presents with generalized weakness, hx of cognitive deficits due to dementia, impaired balance, pain and impaired mobility s/p above. Pt is not a reliable historian but per chart review, pt resides at a skilled nursing facility and is w/c bound at baseline. Likely requires assist for ADLs. Today, pt requires Min A-Mod A for bed mobility. Total A for feeding sitting EOB today. Session limited due to lethargy and pt returning self to bed without warning. Would benefit from return to SNF to familiar environment/people. Will follow acutely to further progress mobility and prevent further deconditioning while in the hospital.    Follow Up Recommendations SNF;Supervision for mobility/OOB;Supervision/Assistance - 24 hour    Equipment Recommendations  None recommended by PT    Recommendations for Other Services       Precautions / Restrictions Precautions Precautions: Fall Precaution Comments: w/c bound Restrictions Weight Bearing Restrictions: No      Mobility  Bed Mobility Overal bed mobility: Needs Assistance Bed Mobility: Supine to Sit;Sit to Supine     Supine to sit: Min assist;HOB elevated Sit to supine: Mod assist   General bed mobility comments: assist to elevate trunk to get to EOB, right lateral lean on elbow. Able to move legs. Pt laying self down unannounced during session needing assist to bring LEs into bed.    Transfers                 General transfer comment: Deferred due to lethargy.  Ambulation/Gait                Stairs            Wheelchair Mobility    Modified  Rankin (Stroke Patients Only)       Balance Overall balance assessment: Needs assistance Sitting-balance support: Feet supported;Single extremity supported Sitting balance-Leahy Scale: Poor Sitting balance - Comments: Requires UE support to maintain sitting in right lateral lean. Total A to help pt eat some lunch, "I cannot feed myself sitting here." Postural control: Right lateral lean                                   Pertinent Vitals/Pain Pain Assessment: Faces Faces Pain Scale: Hurts little more Pain Location: abdomen Pain Descriptors / Indicators: Aching Pain Intervention(s): Monitored during session;Repositioned    Home Living Family/patient expects to be discharged to:: Skilled nursing facility                 Additional Comments: From [facility] per chart review    Prior Function Level of Independence: Needs assistance         Comments: Pt not able to provide appropriate PLOF/history. per chart review, pt is w/c bound and likely assist for ADls.     Hand Dominance        Extremity/Trunk Assessment   Upper Extremity Assessment Upper Extremity Assessment: Defer to OT evaluation    Lower Extremity Assessment Lower Extremity Assessment: Generalized weakness;Difficult to assess due to impaired cognition (Able to perform LAQ lacking full knee extension and full hip flexion)    Cervical / Trunk Assessment Cervical /  Trunk Assessment: Kyphotic;Other exceptions Cervical / Trunk Exceptions: posterior pelvic tilt and right lateral trunk lean  Communication   Communication: HOH  Cognition Arousal/Alertness: Lethargic Behavior During Therapy: WFL for tasks assessed/performed Overall Cognitive Status: No family/caregiver present to determine baseline cognitive functioning                                 General Comments: Oriented to self. Follows simple commands. Not a reliable historian. Hx of dementia. When asked where she  lives. "wherever I can find a bed to sleep."      General Comments      Exercises     Assessment/Plan    PT Assessment Patient needs continued PT services  PT Problem List Decreased strength;Decreased mobility;Decreased cognition;Decreased activity tolerance;Decreased balance;Pain       PT Treatment Interventions Therapeutic activities;Therapeutic exercise;Patient/family education;Balance training;Wheelchair mobility training;Functional mobility training    PT Goals (Current goals can be found in the Care Plan section)  Acute Rehab PT Goals Patient Stated Goal: to go to sleep PT Goal Formulation: With patient Time For Goal Achievement: 03/06/20 Potential to Achieve Goals: Fair    Frequency Min 2X/week   Barriers to discharge Decreased caregiver support      Co-evaluation               AM-PAC PT "6 Clicks" Mobility  Outcome Measure Help needed turning from your back to your side while in a flat bed without using bedrails?: A Little Help needed moving from lying on your back to sitting on the side of a flat bed without using bedrails?: A Lot Help needed moving to and from a bed to a chair (including a wheelchair)?: A Lot Help needed standing up from a chair using your arms (e.g., wheelchair or bedside chair)?: A Lot Help needed to walk in hospital room?: Total Help needed climbing 3-5 steps with a railing? : Total 6 Click Score: 11    End of Session   Activity Tolerance: Patient limited by lethargy;Patient limited by fatigue Patient left: in bed;with call bell/phone within reach;with bed alarm set Nurse Communication: Mobility status;Need for lift equipment PT Visit Diagnosis: Muscle weakness (generalized) (M62.81)    Time: 4010-2725 PT Time Calculation (min) (ACUTE ONLY): 16 min   Charges:   PT Evaluation $PT Eval Moderate Complexity: 1 Mod          Vale Haven, PT, DPT Acute Rehabilitation Services Pager [phone] Office  [phone]      Blake Divine A Lanier Ensign 02/21/2020, 2:38 PM

## 2020-02-22 LAB — CULTURE, BLOOD (ROUTINE X 2): Culture: NO GROWTH

## 2020-07-21 DEATH — deceased

## 2022-07-18 IMAGING — DX DG CHEST 1V PORT
1 series · 1 of 1 positions shown · non-contrast
Comparison: 5251

CLINICAL DATA: Possible sepsis

EXAM:
PORTABLE CHEST 1 VIEW

[chest ap]
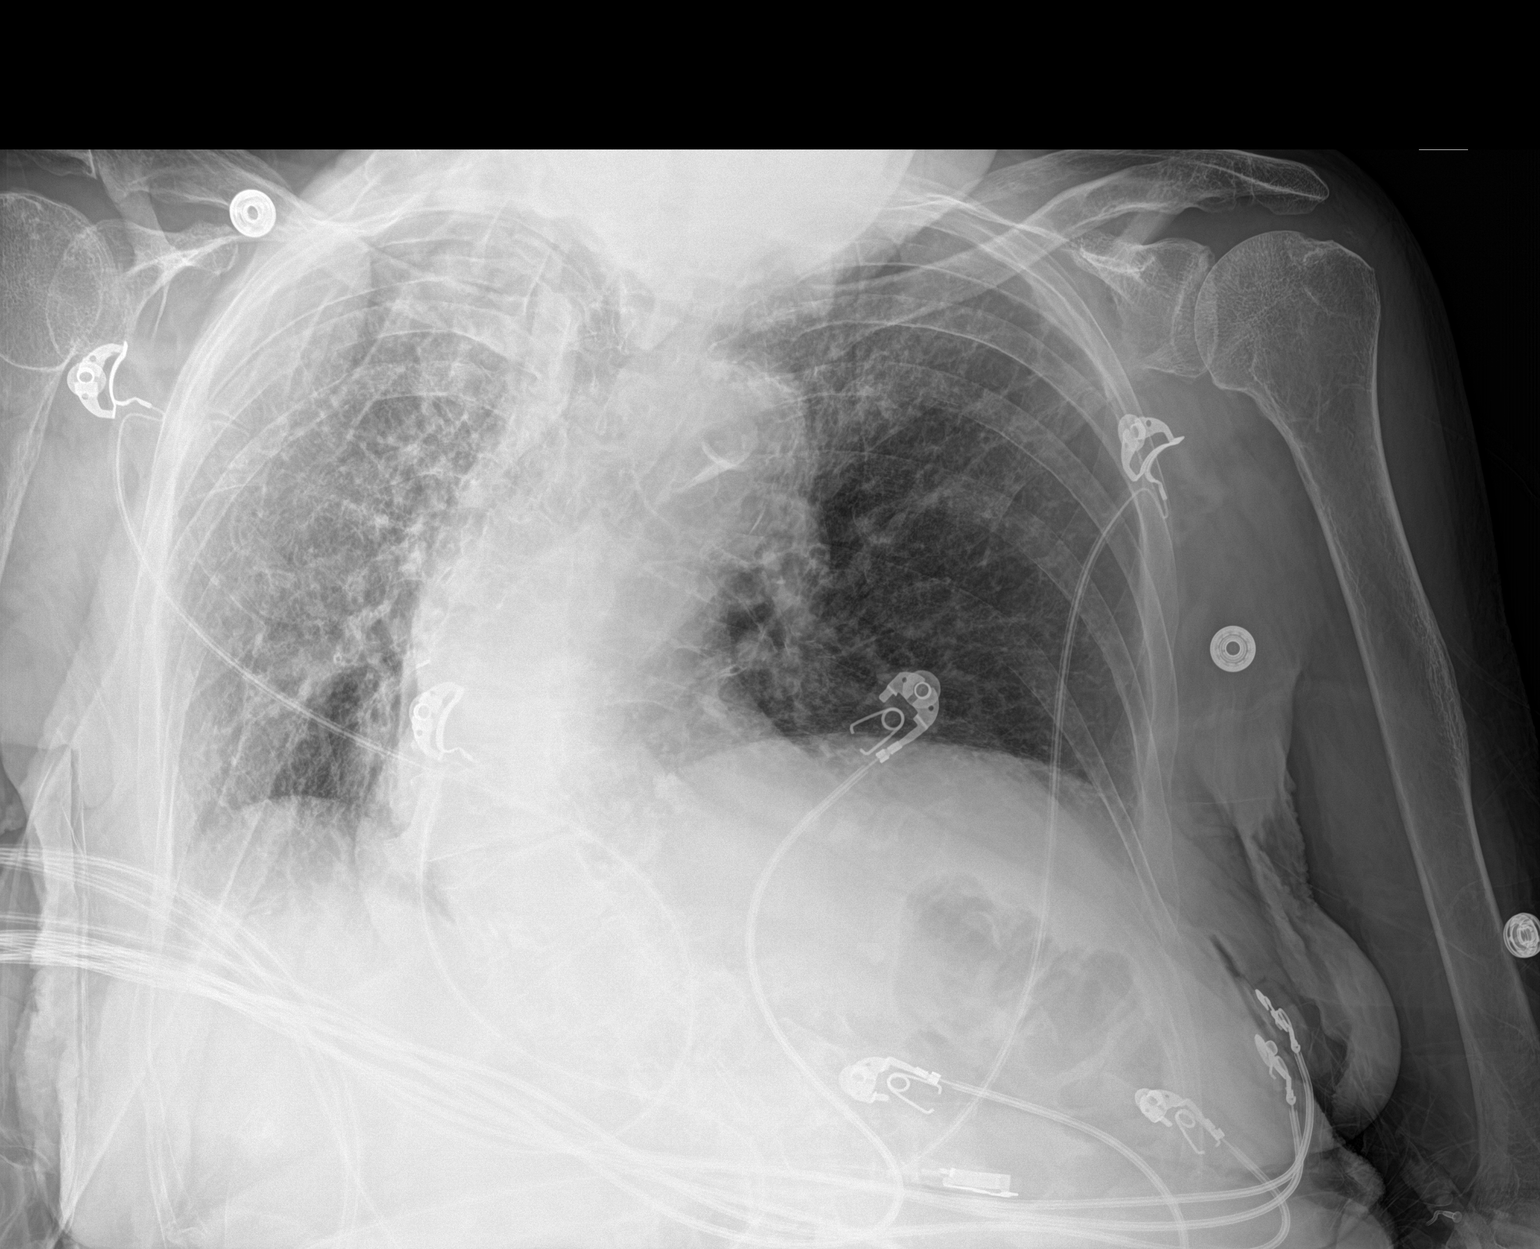

[1 of 1 positions shown; findings below may reference images not displayed]

FINDINGS: Rotated. Distorted thorax related to marked scoliosis. No definite
new consolidation or edema. Chronic interstitial changes remain
similar. No significant pleural effusion. No pneumothorax. Similar
cardiomediastinal contours.
IMPRESSION: No definite acute process in the chest. Chronic interstitial
changes.

## 2022-07-20 IMAGING — CT CT MAXILLOFACIAL W/ CM
3 series · 15 of 47 positions shown, 18 images · IV contrast (omnipaque)
Comparison: None.

CLINICAL DATA: Facial pain

EXAM:
CT MAXILLOFACIAL WITH CONTRAST
TECHNIQUE: Multidetector CT imaging of the maxillofacial structures was
performed with intravenous contrast. Multiplanar CT image
reconstructions were also generated.
CONTRAST:  75mL OMNIPAQUE IOHEXOL 300 MG/ML  SOLN

[Series 3: facialbone 2.0 st · axial · 0.35mm/px · z∈[+1156,+1332]mm · 9 of 102 slices shown, 12 images]
[im 7/102  brain]
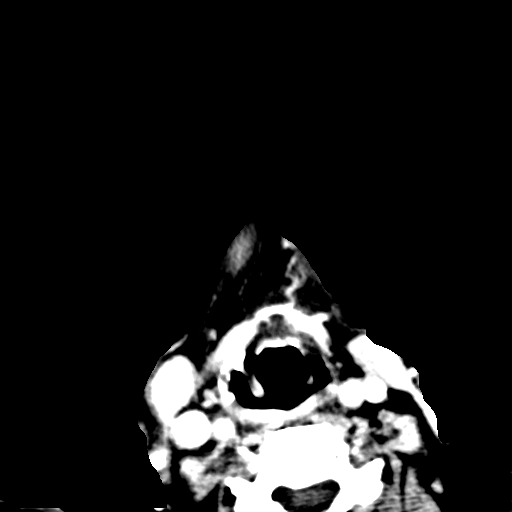
[im 7/102  bone]
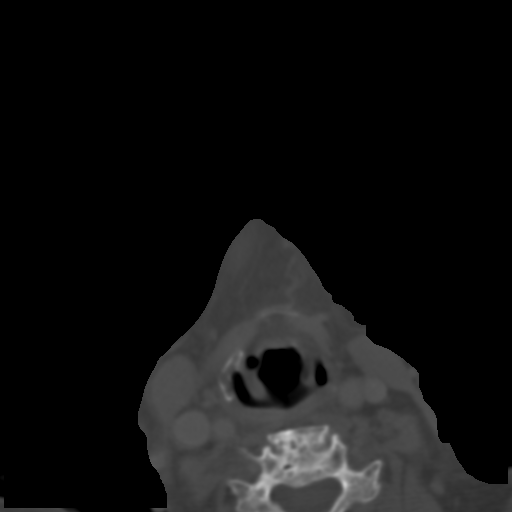
[im 18/102  bone]
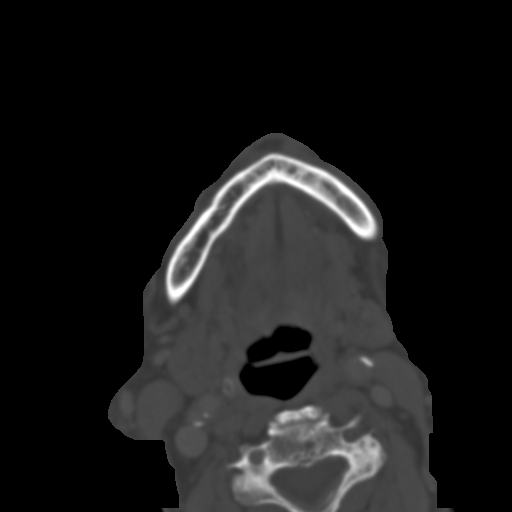
[im 28/102  bone]
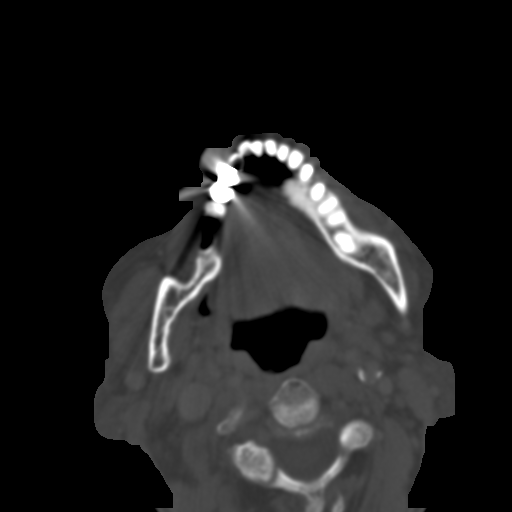
[im 39/102  bone]
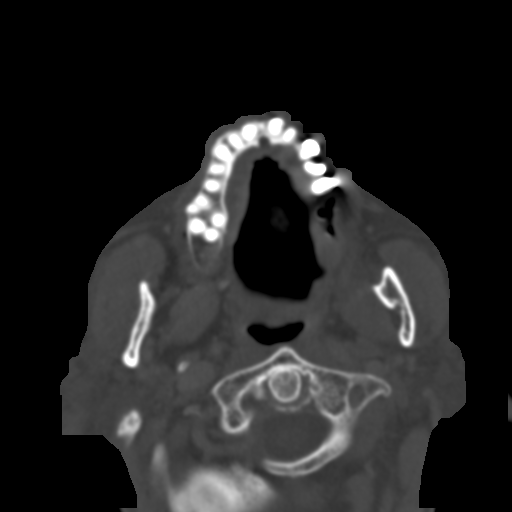
[im 53/102  brain]
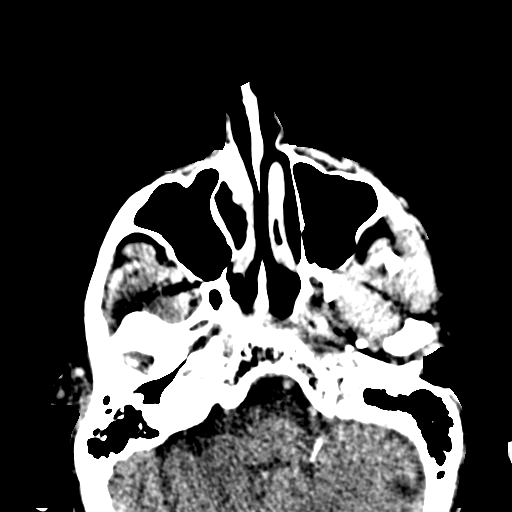
[im 53/102  bone]
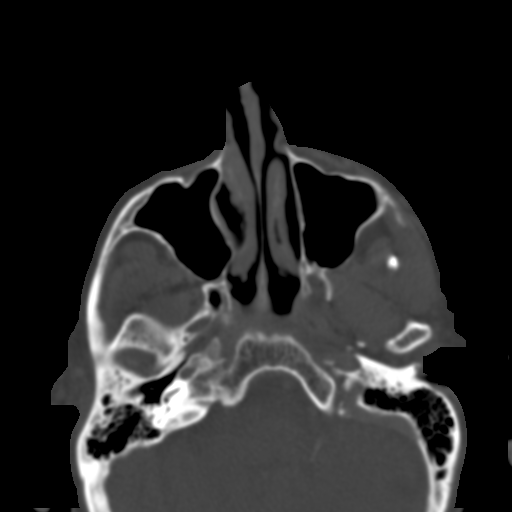
[im 63/102  bone]
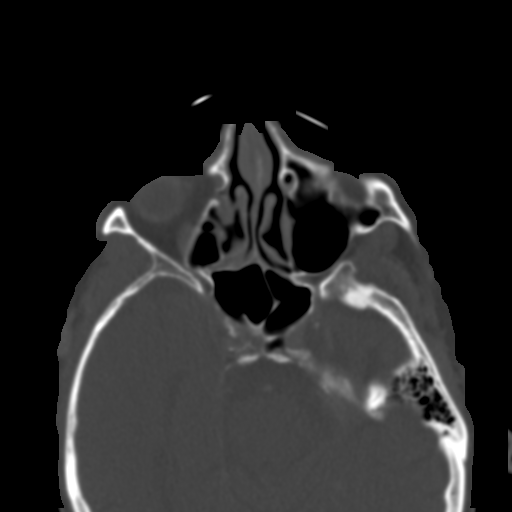
[im 74/102  bone]
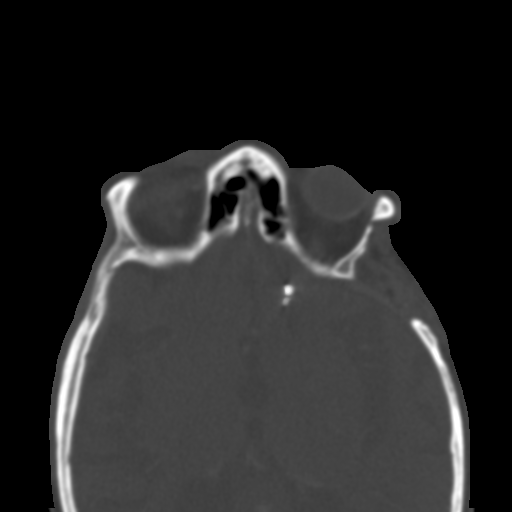
[im 84/102  bone]
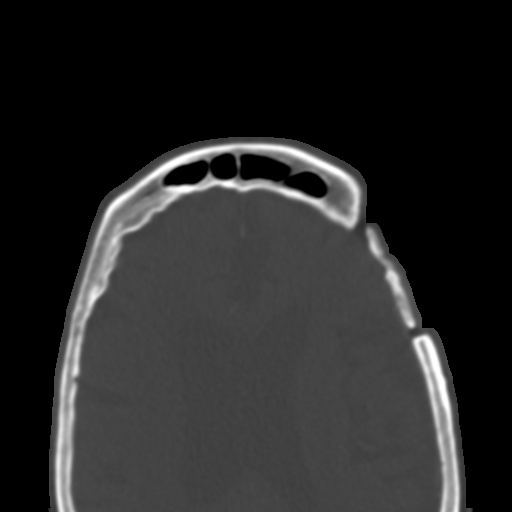
[im 95/102  brain]
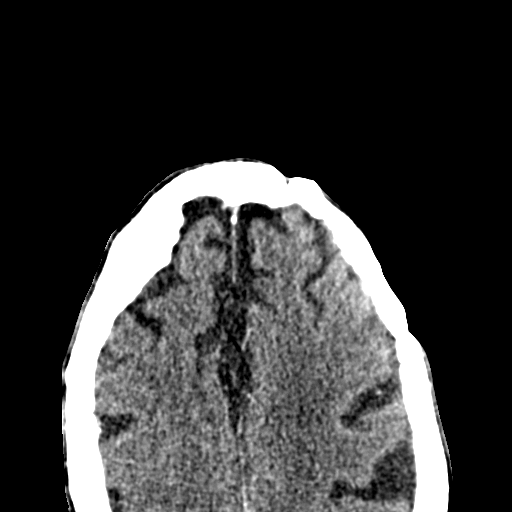
[im 95/102  bone]
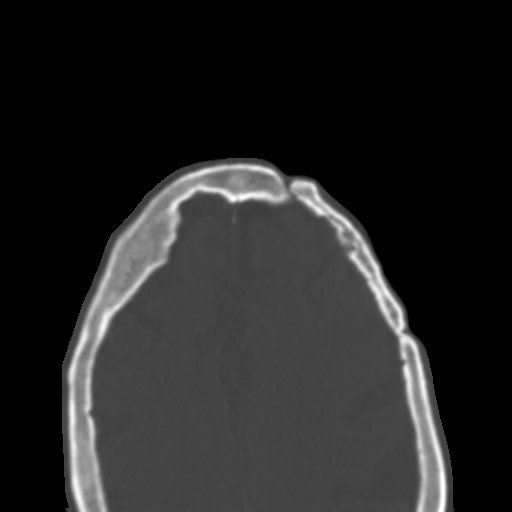

[Series 9: facialbone 2.0 cor st · coronal · 0.34mm/px · 3 of 76 slices shown]
[im 26/76  bone]
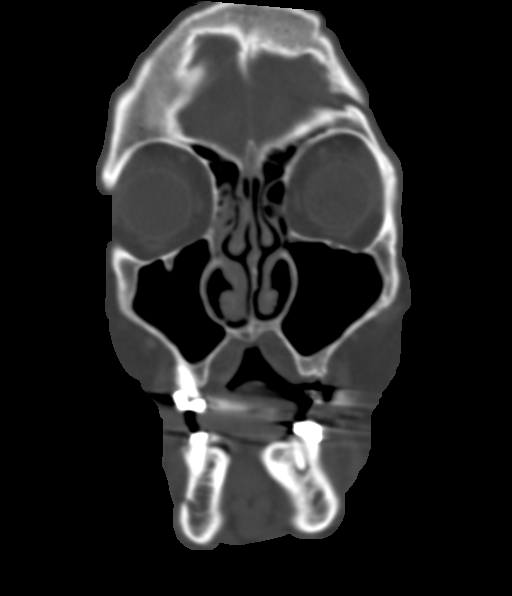
[im 34/76  bone]
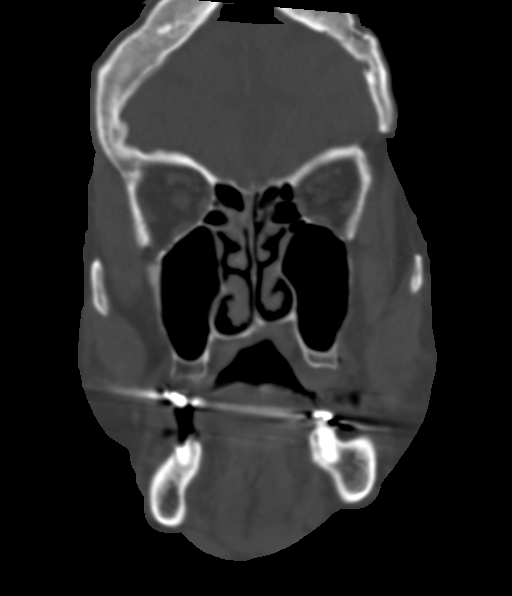
[im 42/76  bone]
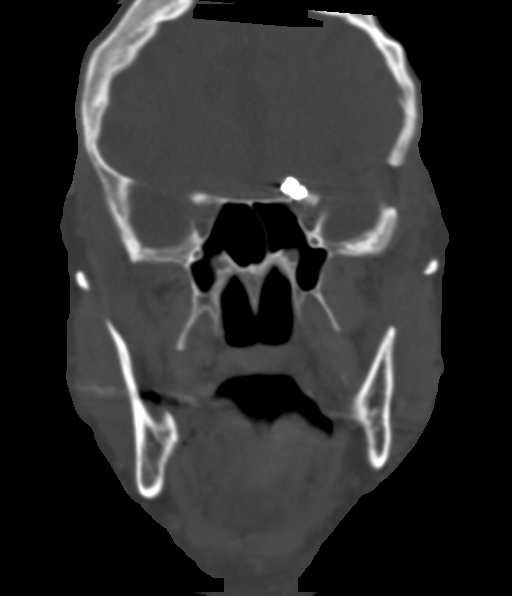

[Series 10: facialbone 2.0 sag st · sagittal · 0.29mm/px · 3 of 79 slices shown]
[im 27/79  bone]
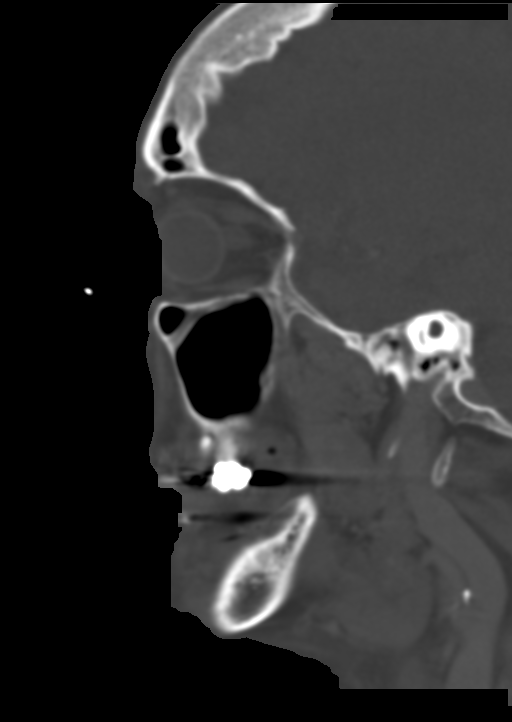
[im 40/79  bone]
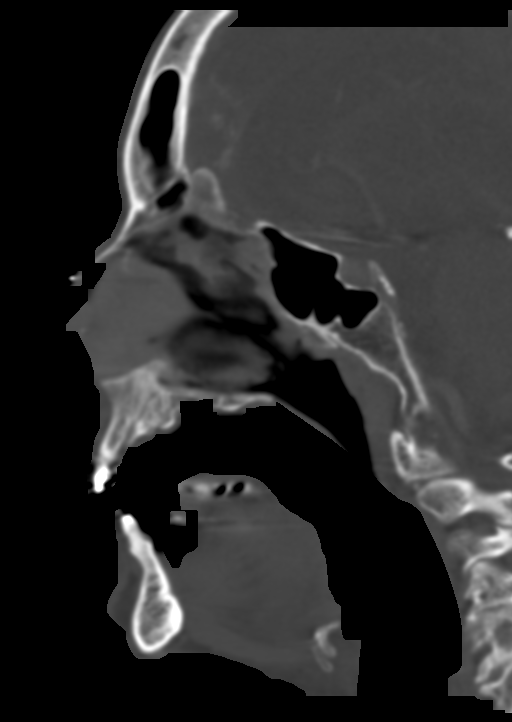
[im 53/79  bone]
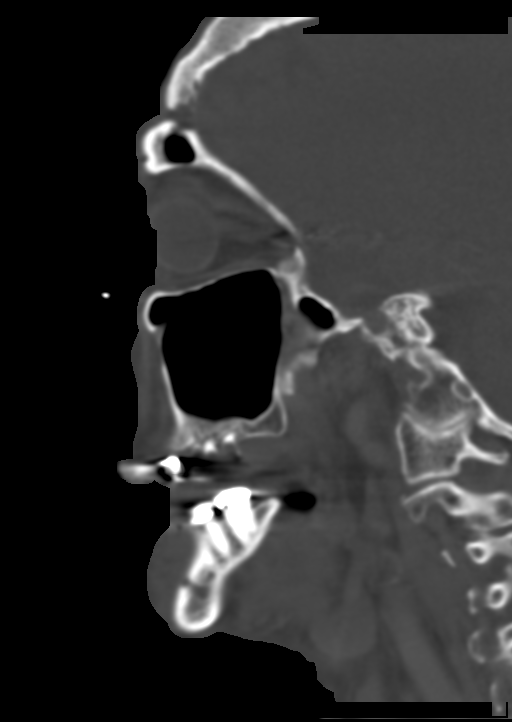

[15 of 47 positions shown; findings below may reference images not displayed]

FINDINGS: Osseous:

Postsurgical changes of remote left pterional craniotomy. No acute
fracture.

Orbits: The globes are intact. Normal appearance of the intra- and
extraconal fat. Symmetric extraocular muscles.

Sinuses: No fluid levels or advanced mucosal thickening.

Soft tissues: Normal visualized extracranial soft tissues.

Limited intracranial: Aneurysm clip near the left carotid terminus.
IMPRESSION: 1. No acute abnormality of the face.
2. Postsurgical changes of remote left pterional craniotomy and
aneurysm clip near the left carotid terminus.
